# Patient Record
Sex: Male | Born: 1955 | Race: White | Hispanic: No | Marital: Single | Smoking: Never smoker
Health system: Southern US, Community
[De-identification: ages and names within clinical notes are randomized; demographics above are authoritative.]

## PROBLEM LIST (undated history)

## (undated) DIAGNOSIS — I509 Heart failure, unspecified: Secondary | ICD-10-CM

## (undated) DIAGNOSIS — E871 Hypo-osmolality and hyponatremia: Secondary | ICD-10-CM

## (undated) DIAGNOSIS — I4892 Unspecified atrial flutter: Secondary | ICD-10-CM

## (undated) DIAGNOSIS — R188 Other ascites: Secondary | ICD-10-CM

## (undated) DIAGNOSIS — K769 Liver disease, unspecified: Secondary | ICD-10-CM

## (undated) DIAGNOSIS — I723 Aneurysm of iliac artery: Secondary | ICD-10-CM

## (undated) DIAGNOSIS — F419 Anxiety disorder, unspecified: Secondary | ICD-10-CM

## (undated) DIAGNOSIS — I4891 Unspecified atrial fibrillation: Secondary | ICD-10-CM

## (undated) DIAGNOSIS — I428 Other cardiomyopathies: Secondary | ICD-10-CM

## (undated) DIAGNOSIS — K219 Gastro-esophageal reflux disease without esophagitis: Secondary | ICD-10-CM

## (undated) DIAGNOSIS — I10 Essential (primary) hypertension: Secondary | ICD-10-CM

---

## 2007-06-27 HISTORY — PX: INGUINAL HERNIA REPAIR: SUR1180

## 2007-07-10 ENCOUNTER — Ambulatory Visit (HOSPITAL_COMMUNITY): Admission: RE | Admit: 2007-07-10 | Discharge: 2007-07-10 | Payer: Self-pay | Admitting: Surgery

## 2011-05-11 NOTE — Op Note (Signed)
NAME:  Corey [name], Corey [name]                ACCOUNT NO.:  [id number]   MEDICAL RECORD NO.:  [id number]          PATIENT TYPE:  AMB   LOCATION:  DAY                          FACILITY:  [facility]   PHYSICIAN:  Ardeth Sportsman, MD     DATE OF BIRTH:  [birth year]   DATE OF PROCEDURE:  07/10/2007  DATE OF DISCHARGE:                               OPERATIVE REPORT   PRIMARY CARE PHYSICIAN:  Unavailable.   SURGEON:  Dr. Estelle Grumbles   ASSISTANT:  None.   PREOPERATIVE DIAGNOSIS:  Bilateral inguinal hernias, incarcerated to the  scrotum.   POSTOPERATIVE DIAGNOSIS:  Bilateral inguinal hernias, incarcerated to  the scrotum.   PROCEDURE PERFORMED:  Laparoscopic bilateral inguinal hernia repairs.   ANESTHESIA:  1. General.  2. Bupivacaine 0.25% in a field block around all port sites as well as      bilateral cord, ilioinguinal nerve, genitofemoral nerve blocks.   SPECIMENS:  None.   DRAINS:  None.   ESTIMATED BLOOD LOSS:  Less than 5 mL.   COMPLICATIONS:  None apparent.   INDICATIONS:  Mr. Gaudin is a 55 year old male, who has had chronic  inguinal hernias.  He was actually seen in our office about 9 years ago,  and recommendation was made for repair of the hernia that was only in  the left groin.  He did not want to have that done.  He has developed a  hernia on the right side as well, and the left side has gotten bigger.  Based on becoming more uncomfortable and now on both sides, he came to  [place] for reconsideration of surgical repair.   The anatomy and embryology of abdominal formation testicular migration  was explained.  Pathophysiology of inguinal herniation with its risks of  incarcerated strangulation and emergency surgery were discussed.  Recommendation was made for laparoscopic bilateral inguinal hernia  repair.  Risks such as stroke, heart attack,, deep venous thrombosis,  pulmonary embolism, and death were discussed.  Risks such as bleeding,  need for transfusion, ecchymosis, hematoma,  seroma, wound infection,  abscess, injury to other organs, urinary retention requiring  catheterization, testicular injury and/or loss and other risks were  discussed.   The patient also has some moles that are somewhat suspicious, one in his  right suprapubic area and one on his right shoulder.  He has had a prior  recommendation for the right groin nevus to be removed, but he refused  that.  Initially, he was willing to have excisional biopsies of these  moles done, but he changed his mind and does not want that done now.  I  talked to him again before surgery, recommending this, and he again  refused.  His mother has tried to convince him as well without success.   OPERATIVE FINDINGS:  He had bilateral scrotal hernias incarcerated but  not strangulated, left larger than right.   DESCRIPTION OF PROCEDURE:  Informed consent was confirmed.  The patient  received IV antibiotics just prior to surgery.  He underwent general  anesthesia without any difficulty.  He had sequential compression  devices active during  the entire case.  Because these were larger  hernias, I went ahead and had a Foley catheter sterilely placed.  The  patient was positioned supine; both arms were tucked.  His abdomen was  clipped, prepped, and draped in a sterile fashion.  After being under  general anesthesia, I could finally reduce the bilateral inguinal  hernias to make the dissection a little easier.   Entry was gained in the preperitoneal space through an infraumbilical  curvilinear incision.  Weston Brass was made in the anterior rectus fascia just  right and left of the midline.  I tried to free off some of the linea  alba off.  It was a little scarred in there, so I ultimately decided to  go just posterior to the right rectus abdominis muscle.  Capnopreperitoneum to 15 mmHg provided good abdominal insufflation.   Camera detection was used to help free the peritoneum off the anterior  abdominal wall and the  lower right and left lower quadrants.  Enough  working space was created such that a 5 mm port could be placed in the  right mid abdomen and left mid abdomen.   Attention was turned towards the larger and more chronic left side.  Peritoneum was freed up the abdominal wall laterally.  When it was made,  helped free the anteromedial bladder off the pelvic wall, taking care to  lift the midline raphe structures intact.  Peritoneum could be seen  crawling with the cord structures.  It seemed obviously large.  Peritoneum was freed off the cords structures circumferentially with a  large hernia sac ultimately reduced using primary blunt and some  controlled sharp dissection.  The sac was ultimately reduced intact into  the preperitoneal space.  Peritoneum was freed off the cord structures  as proximally as possible as well as laterally, superiorly, and then  medially.   Dissection was carried out in a mirror-image fashion on the right side.  Again, there was incarcerated right inguinal hernia indirect.  It was  not as large, but it was pretty close to it.   Two 15 x 15 cm Parietex polyester meshes were cut to a half-skull shape.  One mesh was cut for each side.  Mesh was placed such that a medial and  inferior flap rested in the true pelvis between the anteromedial wall of  the bladder and around the level of the obturator foramen.  The mesh lay  well inferiorly laterally, superiorly, and medially such that there was  at least 3 inches in circumferential coverage around the large dilated  inguinal ring and indirect defects.  This was done for each side in the  mirror-image fashion.   Lead points of hernia sacs are grasped and elevated cephalad.  The  capnopreperitoneum was evacuated.  Ports were removed.  Fascial defect  infraumbilically was closed using 0 Vicryl interrupted stitches x2.  Skin was closed using 4-0 Monocryl.  Sterile dressing was applied.  The  patient was extubated and  taken to the recovery room in stable  condition.   I explained the operative findings to the patient's mother.  Postop  instructions are discussed in detail.  Opportunity is given for  questions, and these were asked and answered.  She expressed  understanding and appreciation.      Ardeth Sportsman, MD  Electronically Signed     SCG/MEDQ  D:  07/10/2007  T:  07/10/2007  Job:  [id number]

## 2011-07-28 HISTORY — PX: CARDIOVERSION: SHX1299

## 2011-08-06 ENCOUNTER — Emergency Department (HOSPITAL_COMMUNITY): Payer: Self-pay

## 2011-08-06 ENCOUNTER — Inpatient Hospital Stay (HOSPITAL_COMMUNITY)
Admission: EM | Admit: 2011-08-06 | Discharge: 2011-08-11 | DRG: 308 | Disposition: A | Discharge status: Home / Self Care | Payer: Self-pay | Attending: Internal Medicine | Admitting: Internal Medicine

## 2011-08-06 DIAGNOSIS — J90 Pleural effusion, not elsewhere classified: Secondary | ICD-10-CM | POA: Diagnosis present

## 2011-08-06 DIAGNOSIS — R911 Solitary pulmonary nodule: Secondary | ICD-10-CM | POA: Diagnosis present

## 2011-08-06 DIAGNOSIS — I5021 Acute systolic (congestive) heart failure: Secondary | ICD-10-CM | POA: Diagnosis present

## 2011-08-06 DIAGNOSIS — F411 Generalized anxiety disorder: Secondary | ICD-10-CM | POA: Diagnosis not present

## 2011-08-06 DIAGNOSIS — E7800 Pure hypercholesterolemia, unspecified: Secondary | ICD-10-CM | POA: Diagnosis present

## 2011-08-06 DIAGNOSIS — I2510 Atherosclerotic heart disease of native coronary artery without angina pectoris: Secondary | ICD-10-CM | POA: Diagnosis present

## 2011-08-06 DIAGNOSIS — I10 Essential (primary) hypertension: Secondary | ICD-10-CM | POA: Diagnosis present

## 2011-08-06 DIAGNOSIS — R748 Abnormal levels of other serum enzymes: Secondary | ICD-10-CM | POA: Diagnosis present

## 2011-08-06 DIAGNOSIS — I509 Heart failure, unspecified: Secondary | ICD-10-CM | POA: Diagnosis present

## 2011-08-06 DIAGNOSIS — E871 Hypo-osmolality and hyponatremia: Secondary | ICD-10-CM | POA: Diagnosis not present

## 2011-08-06 DIAGNOSIS — I2584 Coronary atherosclerosis due to calcified coronary lesion: Secondary | ICD-10-CM | POA: Diagnosis present

## 2011-08-06 DIAGNOSIS — E876 Hypokalemia: Secondary | ICD-10-CM | POA: Diagnosis not present

## 2011-08-06 DIAGNOSIS — I4892 Unspecified atrial flutter: Principal | ICD-10-CM | POA: Diagnosis present

## 2011-08-06 DIAGNOSIS — N179 Acute kidney failure, unspecified: Secondary | ICD-10-CM | POA: Diagnosis present

## 2011-08-06 LAB — URINALYSIS, ROUTINE W REFLEX MICROSCOPIC
Ketones, ur: 15 mg/dL — AB
Nitrite: NEGATIVE
pH: 6 (ref 5.0–8.0)

## 2011-08-06 LAB — CARDIAC PANEL(CRET KIN+CKTOT+MB+TROPI): Total CK: 93 U/L (ref 7–232)

## 2011-08-06 LAB — CBC
MCHC: 33.7 g/dL (ref 30.0–36.0)
Platelets: 307 10*3/uL (ref 150–400)
RDW: 13.9 % (ref 11.5–15.5)

## 2011-08-06 LAB — BASIC METABOLIC PANEL
Calcium: 9.2 mg/dL (ref 8.4–10.5)
GFR calc Af Amer: >60 mL/min (ref >60)
GFR calc non Af Amer: >60 mL/min (ref >60)
Sodium: 135 mEq/L (ref 135–145)

## 2011-08-06 LAB — POCT I-STAT TROPONIN I
Troponin i, poc: 0.12 ng/mL (ref 0.00–0.08)
Troponin i, poc: 0.03 ng/mL (ref 0.00–0.08)

## 2011-08-06 LAB — DIFFERENTIAL
Basophils Absolute: 0 10*3/uL (ref 0.0–0.1)
Basophils Relative: 1 % (ref 0–1)
Eosinophils Relative: 1 % (ref 0–5)
Monocytes Absolute: 0.6 10*3/uL (ref 0.1–1.0)

## 2011-08-06 LAB — TROPONIN I: Troponin I: <0.3 ng/mL (ref <0.30)

## 2011-08-06 LAB — PRO B NATRIURETIC PEPTIDE: Pro B Natriuretic peptide (BNP): 3245 pg/mL — ABNORMAL HIGH (ref 0–125)

## 2011-08-06 LAB — MRSA PCR SCREENING: MRSA by PCR: NEGATIVE

## 2011-08-06 LAB — CK TOTAL AND CKMB (NOT AT ARMC): Relative Index: 3.5 — ABNORMAL HIGH (ref 0.0–2.5)

## 2011-08-06 LAB — D-DIMER, QUANTITATIVE: D-Dimer, Quant: 1.23 ug/mL-FEU — ABNORMAL HIGH (ref 0.00–0.48)

## 2011-08-06 MED ORDER — IOHEXOL 300 MG/ML  SOLN
100.0000 mL | Freq: Once | INTRAMUSCULAR | Status: AC | PRN
  Administered 2011-08-06: 100 mL via INTRAVENOUS

## 2011-08-07 LAB — URINE CULTURE
Colony Count: NO GROWTH
Culture  Setup Time: 201208101511

## 2011-08-07 LAB — CARDIAC PANEL(CRET KIN+CKTOT+MB+TROPI)
CK, MB: 4 ng/mL (ref 0.3–4.0)
Total CK: 69 U/L (ref 7–232)
Troponin I: <0.3 ng/mL (ref <0.30)

## 2011-08-07 LAB — HEPARIN LEVEL (UNFRACTIONATED)
Heparin Unfractionated: 0.11 IU/mL — ABNORMAL LOW (ref 0.30–0.70)
Heparin Unfractionated: 0.1 IU/mL — ABNORMAL LOW (ref 0.30–0.70)

## 2011-08-07 NOTE — H&P (Signed)
NAMEAMARRI, [name] NO.:  [id number]  MEDICAL RECORD NO.:  [id number]  LOCATION:  2923                         FACILITY:  [facility]  PHYSICIAN:  Tarry Kos, MD       DATE OF BIRTH:  [birth year]  DATE OF ADMISSION:  08/06/2011 DATE OF DISCHARGE:                             HISTORY & PHYSICAL   CHIEF COMPLAINT:  Shortness of breath for 2 weeks.  HISTORY OF PRESENT ILLNESS:  Mr. Corey [name] is a pleasant 55 year old male with a history of hypertension only who presents to the emergency department after having 2 weeks of progressive worsening shortness of breath.  It got to the point this morning where he could not breathe at all and was coughing up some frothy material and came to the ED.  He went to Urgent Care over a week ago, was found to have a heart rate of 150, was given a diuretic and a blood pressure medication, and was told that he possibly could have fluid on his lungs.  He went home, did not get any better, presented back to the Urgent Care this past Tuesday at which point they gave him some antibiotics, azithromycin.  He started the antibiotics, azithromycin and has continued to have shortness of breath.  Again, he is currently found to be in A flutter and some mild congestive heart failure.  This is all new for him.  He does not have any previous coronary or cardiac issues.  Cardiologist, Dr. Anne Fu has already seen the patient in the emergency department and has already written orders for appropriate medications.  REVIEW OF SYSTEMS:  He has not been running any fevers and no nausea, vomiting, or diarrhea and no lower extremity edema.  PAST MEDICAL HISTORY: 1. Hypertension. 2. Status post inguinal hernia repairs.  ALLERGIES:  None.  MEDICATIONS:  Lisinopril/hydrochlorothiazide and azithromycin.  SOCIAL HISTORY:  Nonsmoker.  No alcohol.  No IV drug abuse.  PHYSICAL EXAMINATION:  VITAL SIGNS:  Temperature is 97.5, blood pressure 139/96, heart rate  150, respirations 16, and 97% O2 sats on room air. GENERAL:  He is alert and oriented x4, in no apparent distress, cooperative and friendly. HEENT:  Extraocular muscles are intact.  Pupils are equal to light. Oropharynx is clear.  Mucous membranes are moist. NECK:  No JVD.  No carotid bruits. CARDIAC:  Irregular rate, regular rhythm without murmurs, rubs, or gallops. CHEST:  Mild decreased breath sounds at the bases.  No crackles.  No wheezes, rhonchi, or rales. ABDOMEN:  Soft, nontender, and nondistended.  Positive bowel sounds.  No hepatosplenomegaly. EXTREMITIES:  No clubbing, cyanosis, or edema. PSYCH:  Normal mood and affect. NEURO:  No focal neurologic deficits. SKIN:  No rashes.  12-lead EKG, SVT with a rate of 148, it looks like it is probably A flutter.  There are no acute ST-T wave changes.  CK-MB is normal. Troponin is negative.  BNP is 3245.  Urinalysis:  Specific gravity over 1.046.  Electrolytes normal.  BUN and creatinine normal.  D-dimer was positive at 1.23.  CBC is normal..  CTA of the chest showed some findings for central edema, showed some questionable coronary artery atherosclerosis which is advanced  for his age and a right lower lobe nodule.  ASSESSMENT/PLAN:  This is a 55 year old male with questionable atrial flutter with congestive heart failure. 1. Congestive heart failure, likely secondary to uncontrolled rate     with new onset atrial flutter.  Cardiology has already seen the     patient.  He has no other active medical issues.  He is being given     metoprolol to help control his rate.  Serial enzymes and thyroid     studies are pending.  He is going to get a dose of Lasix 2-D, 2-D     echo.  Heparin IV has been started.  Continue ACE inhibitor.  He is     on aspirin. 2. Abnormal CT with pulmonary nodule.  This will need outpatient     followup in 3 months. 3. Hypertension.  This is stable.  Continue ACE inhibitor and add on     beta-blocker. 4.  The patient is full code.  Further conditions pending overall     hospital course.          ______________________________ Tarry Kos, MD     RD/MEDQ  D:  08/06/2011  T:  08/07/2011  Job:  [id number]  Electronically Signed by Tarry Kos MD on 08/07/2011 05:54:05 PM

## 2011-08-08 LAB — BASIC METABOLIC PANEL
BUN: 44 mg/dL — ABNORMAL HIGH (ref 6–23)
CO2: 23 mEq/L (ref 19–32)
Calcium: 8.8 mg/dL (ref 8.4–10.5)
Creatinine, Ser: 2.01 mg/dL — ABNORMAL HIGH (ref 0.50–1.35)
GFR calc non Af Amer: 35 mL/min — ABNORMAL LOW (ref >60)
Glucose, Bld: 124 mg/dL — ABNORMAL HIGH (ref 70–99)
Sodium: 133 mEq/L — ABNORMAL LOW (ref 135–145)

## 2011-08-08 LAB — COMPREHENSIVE METABOLIC PANEL
ALT: 1053 U/L — ABNORMAL HIGH (ref 0–53)
Alkaline Phosphatase: 89 U/L (ref 39–117)
BUN: 35 mg/dL — ABNORMAL HIGH (ref 6–23)
CO2: 19 mEq/L (ref 19–32)
GFR calc Af Amer: 52 mL/min — ABNORMAL LOW (ref >60)
GFR calc non Af Amer: 43 mL/min — ABNORMAL LOW (ref >60)
Glucose, Bld: 127 mg/dL — ABNORMAL HIGH (ref 70–99)
Potassium: 4.5 mEq/L (ref 3.5–5.1)
Sodium: 134 mEq/L — ABNORMAL LOW (ref 135–145)
Total Bilirubin: 1 mg/dL (ref 0.3–1.2)
Total Protein: 5.8 g/dL — ABNORMAL LOW (ref 6.0–8.3)

## 2011-08-08 LAB — CBC
Hemoglobin: 14.4 g/dL (ref 13.0–17.0)
MCH: 30.1 pg (ref 26.0–34.0)
MCHC: 34.1 g/dL (ref 30.0–36.0)
MCV: 88.1 fL (ref 78.0–100.0)
Platelets: 270 10*3/uL (ref 150–400)

## 2011-08-08 LAB — PROTIME-INR: Prothrombin Time: 21.6 seconds — ABNORMAL HIGH (ref 11.6–15.2)

## 2011-08-08 LAB — HEPARIN LEVEL (UNFRACTIONATED): Heparin Unfractionated: 0.29 IU/mL — ABNORMAL LOW (ref 0.30–0.70)

## 2011-08-09 ENCOUNTER — Inpatient Hospital Stay (HOSPITAL_COMMUNITY): Payer: Self-pay

## 2011-08-09 LAB — CBC
HCT: 41.4 % (ref 39.0–52.0)
Hemoglobin: 13.8 g/dL (ref 13.0–17.0)
MCH: 29.4 pg (ref 26.0–34.0)
MCHC: 33.3 g/dL (ref 30.0–36.0)
MCV: 88.3 fL (ref 78.0–100.0)
RDW: 14 % (ref 11.5–15.5)

## 2011-08-09 LAB — COMPREHENSIVE METABOLIC PANEL
Albumin: 3.3 g/dL — ABNORMAL LOW (ref 3.5–5.2)
Alkaline Phosphatase: 68 U/L (ref 39–117)
BUN: 49 mg/dL — ABNORMAL HIGH (ref 6–23)
Creatinine, Ser: 1.8 mg/dL — ABNORMAL HIGH (ref 0.50–1.35)
GFR calc Af Amer: 48 mL/min — ABNORMAL LOW (ref >60)
Glucose, Bld: 82 mg/dL (ref 70–99)
Potassium: 4.4 mEq/L (ref 3.5–5.1)
Total Protein: 5.6 g/dL — ABNORMAL LOW (ref 6.0–8.3)

## 2011-08-09 LAB — BASIC METABOLIC PANEL
CO2: 31 mEq/L (ref 19–32)
Calcium: 8.5 mg/dL (ref 8.4–10.5)
GFR calc non Af Amer: 47 mL/min — ABNORMAL LOW (ref >60)
Potassium: 3.7 mEq/L (ref 3.5–5.1)
Sodium: 130 mEq/L — ABNORMAL LOW (ref 135–145)

## 2011-08-09 LAB — PROTIME-INR: INR: 1.66 — ABNORMAL HIGH (ref 0.00–1.49)

## 2011-08-10 LAB — COMPREHENSIVE METABOLIC PANEL
ALT: 599 U/L — ABNORMAL HIGH (ref 0–53)
AST: 243 U/L — ABNORMAL HIGH (ref 0–37)
CO2: 38 mEq/L — ABNORMAL HIGH (ref 19–32)
Chloride: 87 mEq/L — ABNORMAL LOW (ref 96–112)
GFR calc non Af Amer: >60 mL/min (ref >60)
Glucose, Bld: 92 mg/dL (ref 70–99)
Sodium: 133 mEq/L — ABNORMAL LOW (ref 135–145)
Total Bilirubin: 1 mg/dL (ref 0.3–1.2)

## 2011-08-10 LAB — CBC
Hemoglobin: 14.1 g/dL (ref 13.0–17.0)
MCV: 86.8 fL (ref 78.0–100.0)
Platelets: 215 10*3/uL (ref 150–400)
RBC: 4.76 MIL/uL (ref 4.22–5.81)
WBC: 9.7 10*3/uL (ref 4.0–10.5)

## 2011-08-10 LAB — POTASSIUM
Potassium: 3 mEq/L — ABNORMAL LOW (ref 3.5–5.1)
Potassium: 2.9 mEq/L — ABNORMAL LOW (ref 3.5–5.1)
Potassium: 3.2 mEq/L — ABNORMAL LOW (ref 3.5–5.1)

## 2011-08-10 LAB — HEPATITIS PANEL, ACUTE: Hep B C IgM: NEGATIVE

## 2011-08-10 NOTE — Consult Note (Signed)
Corey [name], Corey [name] NO.:  [id number]  MEDICAL RECORD NO.:  [id number]  LOCATION:  2923                         FACILITY:  [facility]  PHYSICIAN:  Jake Bathe, MD      DATE OF BIRTH:  1956/12/15  DATE OF CONSULTATION:  08/06/2011 DATE OF DISCHARGE:                                CONSULTATION   REQUESTING PHYSICIAN:  April Palumbo-Rasch, MD, [facility].  REASON FOR CONSULTATION:  Evaluation of tachycardia, shortness of breath.  HISTORY OF PRESENT ILLNESS:  Corey [name] is a 55 year old male with no prior cardiologist or primary care physician who over the last 2 weeks has been experiencing increased shortness of breath and most recently has noted tachycardia paroxysmally which is symptomatic.  Approximately a week ago, he went to the Urgent Care Center because of this shortness of breath and increasing exertional dyspnea and was given azithromycin and lisinopril for elevated blood pressure.  Today he reported to the [facility] emergency department because of increasing shortness of breath, increasing anxiety surrounding this.  While here in the emergency department, his first set of point-of-care cardiac markers demonstrated a positive troponin, however, the second set was unremarkable at 0.03.  A CT angiogram of the chest was performed after a D-dimer was positive and demonstrated mild pleural effusions, mild cardiomegaly, no pericardial effusion, some coronary artery calcification, and extensive mediastinal edema, right hilar nodal tissue measuring 2.2 cm and possible edema present.  This was personally reviewed.  Chest x-ray demonstrated mildly enlarged cardiac silhouette with possible interstitial edema as well.  On telemetry upon close monitoring, he appears to have atrial flutter underlying, a narrow complex tachycardia.  EKGs will be repeated.  Currently he is comfortable in bed.  No evidence of tachypnea currently, mildly  anxious. His mother is currently present with him.  PAST MEDICAL HISTORY:  No significant past medical history except for hypertension.  ALLERGIES:  No known drug allergies.  MEDICATIONS:  None.  SOCIAL HISTORY:  Denies any smoking, very rare alcohol.  No illicit drug use.  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  FAMILY HISTORY:  His father had heart attack at age 69, but died from a rare muscle disorder.  Mother has atrial fibrillation.  REVIEW OF SYSTEMS:  Denies any recent fevers, chills, nausea, vomiting. It is positive for shortness of breath.  Positive for white mucus production yesterday.  No melena.  No syncope.  No dizziness.  No prior anginal symptoms.  Unless specified above, all other 12 review of systems negative.  PHYSICAL EXAMINATION:  VITAL SIGNS:  Afebrile, blood pressure 139/96, pulse is 148 and consistent, respirations 16, satting 97% on room air. GENERAL:  He is alert and oriented x3 in no acute distress, pleasant male, mildly anxious with his family at bedside. EYES:  Well perfused conjunctivae.  EOMI.  No scleral icterus. NECK:  Supple.  No lymphadenopathy.  No thyromegaly.  No carotid bruits. No JVD. CARDIOVASCULAR:  Tachycardic, regular rhythm without any appreciable murmurs, rubs, or gallops. LUNGS:  Clear to auscultation bilaterally.  Normal respiratory effort except for mildly decreased  breath sounds bilaterally. ABDOMEN:  Soft, nontender, normoactive bowel sounds.  No bruits appreciated. EXTREMITIES:  No clubbing, cyanosis, or edema.  Normal distal pulses. GU:  Deferred. RECTAL:  Deferred. NEUROLOGIC:  Nonfocal, intact.  DATA:  CT angiogram of chest as described above.  Chest x-ray as described above.  EKG as described above.  Most likely atrial flutter underlying.  No ST-segment changes.  TSH and free T4 currently pending. Urinalysis unremarkable except for protein.  Second  set of cardiac point- of-care markers are normal, glucose mildly elevated at 124.  ASSESSMENT/PLAN:  A 55 year old male with supraventricular tachycardia, likely underlying atrial flutter, paroxysmal per history with increasing shortness of breath, mild mucus production. 1. Supraventricular tachycardia, likely atrial flutter - upon close     inspection of telemetry, it does appear to be P-waves marching out     consistent with 2:1 atrial flutter.  He notes that earlier this     morning the tachycardia initiated and he has had periods of time     where he does not feel this.  It is most likely that he is having     some paroxysmal atrial flutter.  I will start by giving him 5 mg of     IV metoprolol and this can be repeated in 10 minutes and heart rate     is not decreased.  I will also give him metoprolol 25 mg p.o. q.6     h. with hold parameters.  He may very well require diltiazem as     well.  However, I would like to check a echocardiogram to ensure     proper cardiac ejection fraction.  Certainly he may be developing a     tachycardia induced cardiomyopathy if his tachycardia has been     persistent for quite some time which could be leading to his     overall shortness of breath.  He does demonstrate some evidence     of mild pulmonary edema on chest x-ray and therefore I will go     ahead and administer Lasix 40 mg p.o. x1 with potassium 20 mEq p.o.     x1 as well.  We will check thyroid functions as described above.     Heparin  IV in case cardioversion is required.  His CHADS2 score is     1 for hypertension currently.  No prior stroke-like symptoms.     Aspirin would be reasonable. If EF poor, then coumadin.  2. Hypertension - continue lisinopril 20 mg a day.  We will continue     to follow along with admitting team.  As stated above, he may     require cardioversion but hopefully given his paroxysmal symptoms,     he will break his tachycardia.  We will continue to  monitor. 3. Dyspnea-as above. Assessing EF for possible systolic versus diastolic     heart failure. Will treat accordingly.      Jake Bathe, MD     MCS/MEDQ  D:  08/06/2011  T:  08/07/2011  Job:  [id number]  cc:   April Palumbo-Rasch, MD  Electronically Signed by Donato Schultz MD on 08/10/2011 06:55:38 AM

## 2011-08-11 LAB — COMPREHENSIVE METABOLIC PANEL
ALT: 544 U/L — ABNORMAL HIGH (ref 0–53)
AST: 227 U/L — ABNORMAL HIGH (ref 0–37)
Albumin: 3.2 g/dL — ABNORMAL LOW (ref 3.5–5.2)
CO2: 35 mEq/L — ABNORMAL HIGH (ref 19–32)
Chloride: 87 mEq/L — ABNORMAL LOW (ref 96–112)
Creatinine, Ser: 1 mg/dL (ref 0.50–1.35)
Potassium: 3.6 mEq/L (ref 3.5–5.1)
Sodium: 131 mEq/L — ABNORMAL LOW (ref 135–145)
Total Bilirubin: 0.9 mg/dL (ref 0.3–1.2)

## 2011-08-11 LAB — CBC
HCT: 43.4 % (ref 39.0–52.0)
MCH: 29.3 pg (ref 26.0–34.0)
MCHC: 33.2 g/dL (ref 30.0–36.0)
MCV: 88.4 fL (ref 78.0–100.0)
Platelets: 235 10*3/uL (ref 150–400)
RDW: 13.8 % (ref 11.5–15.5)
WBC: 10.9 10*3/uL — ABNORMAL HIGH (ref 4.0–10.5)

## 2011-08-11 LAB — PROTIME-INR: INR: 1.42 (ref 0.00–1.49)

## 2011-08-11 NOTE — Discharge Summary (Signed)
Corey [name], Corey [name] NO.:  [id number]  MEDICAL RECORD NO.:  [id number]  LOCATION:  2923                         FACILITY:  [facility]  PHYSICIAN:  Jeoffrey Massed, MD    DATE OF BIRTH:  09/21/1956  DATE OF ADMISSION:  08/06/2011 DATE OF DISCHARGE:                        DISCHARGE SUMMARY - REFERRING   PRIMARY CARDIOLOGIST:  Jake Bathe, MD of [facility].  PRIMARY DISCHARGE DIAGNOSES: 1. Acute systolic heart failure. 2. Supraventricular tachycardia likely atrial flutter status post DC     cardioversion, now on Coumadin therapy. 3. Acute renal failure, now resolved. 4. Transaminitis likely secondary to shock liver, significantly     better. 5. Mild hyponatremia. 6. Hypokalemia secondary to diuretic therapy, significantly better. 7. Abnormal CT scan with pulmonary nodule.  Will need a repeat CT scan     in the next 6-12 months.  PAST MEDICAL HISTORY/SECONDARY DISCHARGE DIAGNOSES:  Hypertension.DISCHARGE MEDICATIONS: 1. Lovenox 90 mg subcutaneously twice daily to take until INR is     between 2 and 3. 2. Lasix 40 mg 1 tablet p.o. daily. 3. Lisinopril 2.5 mg 1 tablet p.o. daily. 4. Lopressor 25 mg 1 tablet twice daily. 5. K-Dur 20 mEq 1 tablet daily. 6. Aspirin 81 mg 1 tablet p.o. daily. 7. Coumadin 7.5 mg p.o. daily.  CONSULTANTS:  Dr. Donato Schultz from [facility].  BRIEF HISTORY OF PRESENT ILLNESS:  Corey [name] is a very pleasant 55 year old white male, who prior to this admission had only a known history of hypertension presented to the hospital for worsening shortness of breath, was found to have tachycardia that was thought secondary to be from SVT likely atrial flutter.  He was also found to have acute congestive systolic heart failure and as a result was admitted to a step- down unit for further closer monitoring.  For further details, please see the history and physical that was dictated by Dr. Onalee Hua on admission.  PERTINENT  RADIOLOGICAL STUDIES: 1. A portable chest x-ray done on August 06, 2011, showed cardiomegaly     with early bibasilar interstitial edema pattern. 2. CT angiogram of the chest showed no evidence of pulmonary embolism,     findings of congestive heart failure.  Age-advanced coronary artery     atherosclerosis, including within the distal left main.  Small     bilateral pleural effusions.  Mild thoracic adenopathy.  This can     be seen with congestive heart failure.  Borderline pulmonary artery     enlargement.  Right lower lung nodule. 3. A 2-D echocardiogram done on August 07, 2011, showed systolic     function to be severely reduced with an EF around 20% to 25% along     with diffuse hypokinesis.  There is moderate-to-severe mitral     regurgitation.  Left atrium was moderately dilated.  The right     atrium was also moderately dilated.  The pulmonary artery pressures     were 33 mmHg. 4. A transesophageal echocardiogram done by Dr. Donato Schultz on August 07, 2011, showed systolic function to be severely reduced with an     EF around 20% to 25%.  There was  no evidence of thrombus in the     left atrial cavity or its appendage.  PERTINENT LABORATORY STUDIES: 1. INR on discharge is 1.42. 2. Creatinine on discharge is 1.0, potassium on discharge is 3.6. 3. LFTs on discharge shows AST of 227 and ALT of 544. 4. Acute hepatitis panel is negative. 5. HbA1c is 6.2.  Cardiac enzymes were cycled and these were negative     as well. 6. TSH was 3.002. 7. Urine culture was negative.  BRIEF HOSPITAL COURSE: 1. Shortness of breath.  This is secondary to acute systolic heart     failure with an EF around 20% to 25% per the echocardiogram done     this admission.  The patient was seen by Cardiology and was placed     on IV diuretic therapy along with ACE inhibitor and beta-blocker.     The patient gradually improved with this therapy.  It is not very     clear as to the exact cause of his  systolic heart failure, perhaps     it could be related to his atrial flutter and possibly be a     tachycardia-induced cardiomyopathy.  However, it also could be     underlying coronary artery disease as well.  Further workup of this     will need to be done when the patient follows up with Cardiology as     an outpatient.  We will defer this workup to his primary     cardiologist now. 2. Atrial flutter.  During this admission, the patient did get IV     metoprolol which did control his atrial flutter, however, during     his admission, he also did require DC cardioversion.  He has been     sustained in sinus ever since.  A transesophageal echocardiogram     done prior to DC cardioversion is noted as above, but did not show     any thrombus in the left atrial cavity or its appendage.  EF was     confirmed to be around 20% to 25%.  The patient continues to do     well and is maintained on low-dose beta-blockers.  He did briefly     require amiodarone, but Cardiology suggested that we discontinue     this for now and he will not be discharged on amiodarone.  He has     been started on Coumadin with overlapping Lovenox and plans are to     discharge home if outpatient Lovenox therapy can be arranged for     this patient later today.  The patient already has a INR check     scheduled at [facility] this coming Friday at 3:45 p.m. and     the patient will then follow up with Dr. Donato Schultz on August 19, 2011, at 11:15 a.m. 3. Pulmonary nodule.  The patient will need a repeat CT scan of the     chest done in the next 6-12 months.  We will defer this to his     primary cardiologist and his primary care practitioner. 4. Hypertension.  This is stable.  The patient will continue with low-     dose lisinopril and beta-blockers.  Further optimization of this     regimen will be done by his primary care practitioner. 5. Mild acute renal failure.  This was in the setting of rapid atrial      flutter and had subsequently normalized.  6. Elevated transaminases.  This is in the setting of low-flow state     secondary to rapid atrial flutter and is highly suspicious for     shock liver.  His liver function enzymes are coming down very     nicely and will need follow up when he follows up with Dr. Anne Fu     as an outpatient next week.  DISPOSITION:  It is thought that the patient is stable to be discharged home.  FOLLOWUP INSTRUCTIONS: 1. The patient will follow up with [facility] on Friday, August 13, 2011, at 3:45 p.m. for an INR check.  Goal INR is between 2 and     3. 2. The patient will then subsequently follow up with Dr. Donato Schultz     of [facility] on Thursday, August 19, 2011, at 11:15 a.m. 3. [facility] per instructions in the pink discharge sheet.  Total time spent for discharge equals 45 minutes.     Jeoffrey Massed, MD     SG/MEDQ  D:  08/11/2011  T:  08/11/2011  Job:  [id number]  cc:   Jake Bathe, MD  Electronically Signed by Jeoffrey Massed  on 08/11/2011 05:48:21 PM

## 2011-08-19 ENCOUNTER — Inpatient Hospital Stay (HOSPITAL_COMMUNITY)
Admission: AD | Admit: 2011-08-19 | Discharge: 2011-08-22 | DRG: 308 | Disposition: A | Discharge status: Home / Self Care | Payer: Self-pay | Source: Ambulatory Visit | Attending: Cardiology | Admitting: Cardiology

## 2011-08-19 ENCOUNTER — Inpatient Hospital Stay (HOSPITAL_COMMUNITY): Payer: Self-pay

## 2011-08-19 DIAGNOSIS — I509 Heart failure, unspecified: Secondary | ICD-10-CM | POA: Diagnosis present

## 2011-08-19 DIAGNOSIS — I428 Other cardiomyopathies: Secondary | ICD-10-CM | POA: Diagnosis present

## 2011-08-19 DIAGNOSIS — Z7982 Long term (current) use of aspirin: Secondary | ICD-10-CM

## 2011-08-19 DIAGNOSIS — I5023 Acute on chronic systolic (congestive) heart failure: Secondary | ICD-10-CM | POA: Diagnosis present

## 2011-08-19 DIAGNOSIS — Z7901 Long term (current) use of anticoagulants: Secondary | ICD-10-CM

## 2011-08-19 DIAGNOSIS — N179 Acute kidney failure, unspecified: Secondary | ICD-10-CM | POA: Diagnosis present

## 2011-08-19 DIAGNOSIS — I4892 Unspecified atrial flutter: Principal | ICD-10-CM | POA: Diagnosis present

## 2011-08-19 LAB — DIFFERENTIAL
Eosinophils Relative: 1 % (ref 0–5)
Lymphocytes Relative: 16 % (ref 12–46)
Lymphs Abs: 1.4 10*3/uL (ref 0.7–4.0)
Monocytes Absolute: 0.7 10*3/uL (ref 0.1–1.0)
Monocytes Relative: 7 % (ref 3–12)
Neutro Abs: 6.7 10*3/uL (ref 1.7–7.7)

## 2011-08-19 LAB — CARDIAC PANEL(CRET KIN+CKTOT+MB+TROPI)
CK, MB: 2.6 ng/mL (ref 0.3–4.0)
Relative Index: INVALID (ref 0.0–2.5)
Relative Index: INVALID (ref 0.0–2.5)
Total CK: 36 U/L (ref 7–232)
Troponin I: 0.46 ng/mL (ref <0.30)
Troponin I: <0.3 ng/mL (ref <0.30)

## 2011-08-19 LAB — COMPREHENSIVE METABOLIC PANEL
ALT: 58 U/L — ABNORMAL HIGH (ref 0–53)
Albumin: 3.1 g/dL — ABNORMAL LOW (ref 3.5–5.2)
Alkaline Phosphatase: 91 U/L (ref 39–117)
Calcium: 9.5 mg/dL (ref 8.4–10.5)
GFR calc Af Amer: >60 mL/min (ref >60)
Potassium: 4.5 mEq/L (ref 3.5–5.1)
Sodium: 136 mEq/L (ref 135–145)
Total Protein: 6 g/dL (ref 6.0–8.3)

## 2011-08-19 LAB — CBC
HCT: 43.5 % (ref 39.0–52.0)
Hemoglobin: 14.6 g/dL (ref 13.0–17.0)
MCH: 29.7 pg (ref 26.0–34.0)
MCHC: 33.6 g/dL (ref 30.0–36.0)
MCV: 88.6 fL (ref 78.0–100.0)
RDW: 14.4 % (ref 11.5–15.5)

## 2011-08-20 LAB — BASIC METABOLIC PANEL
BUN: 14 mg/dL (ref 6–23)
CO2: 24 mEq/L (ref 19–32)
GFR calc non Af Amer: >60 mL/min (ref >60)
Glucose, Bld: 106 mg/dL — ABNORMAL HIGH (ref 70–99)
Potassium: 4.6 mEq/L (ref 3.5–5.1)
Sodium: 137 mEq/L (ref 135–145)

## 2011-08-20 LAB — CARDIAC PANEL(CRET KIN+CKTOT+MB+TROPI)
CK, MB: 2.4 ng/mL (ref 0.3–4.0)
Total CK: 27 U/L (ref 7–232)

## 2011-08-20 LAB — PROTIME-INR: Prothrombin Time: 48 seconds — ABNORMAL HIGH (ref 11.6–15.2)

## 2011-08-21 LAB — BASIC METABOLIC PANEL
CO2: 30 mEq/L (ref 19–32)
Chloride: 97 mEq/L (ref 96–112)
GFR calc Af Amer: >60 mL/min (ref >60)
Potassium: 4.5 mEq/L (ref 3.5–5.1)
Sodium: 135 mEq/L (ref 135–145)

## 2011-08-21 LAB — PROTIME-INR
INR: 4.61 — ABNORMAL HIGH (ref 0.00–1.49)
Prothrombin Time: 44.2 seconds — ABNORMAL HIGH (ref 11.6–15.2)

## 2011-08-21 LAB — PRO B NATRIURETIC PEPTIDE: Pro B Natriuretic peptide (BNP): 4749 pg/mL — ABNORMAL HIGH (ref 0–125)

## 2011-08-22 LAB — PROTIME-INR
INR: 3.61 — ABNORMAL HIGH (ref 0.00–1.49)
Prothrombin Time: 36.5 seconds — ABNORMAL HIGH (ref 11.6–15.2)

## 2011-08-22 LAB — BASIC METABOLIC PANEL
Calcium: 8.7 mg/dL (ref 8.4–10.5)
GFR calc Af Amer: >60 mL/min (ref >60)
GFR calc non Af Amer: >60 mL/min (ref >60)
Glucose, Bld: 93 mg/dL (ref 70–99)
Potassium: 3.6 mEq/L (ref 3.5–5.1)
Sodium: 135 mEq/L (ref 135–145)

## 2011-08-24 NOTE — Op Note (Signed)
  [name], [name]                ACCOUNT NO.:  [id number]  MEDICAL RECORD NO.:  [id number]  LOCATION:  2902                         FACILITY:  [facility]  PHYSICIAN:  Armanda Magic, M.D.     DATE OF BIRTH:  October 28, 1956  DATE OF PROCEDURE:  08/20/2011 DATE OF DISCHARGE:                              OPERATIVE REPORT   PRIMARY CARDIOLOGIST:  Jake Bathe, MD  PROCEDURE:  Direct current cardioversion.  OPERATOR:  Armanda Magic, MD  INDICATION:  Atrial flutter with rapid ventricular response in the setting of CHF.  COMPLICATIONS:  None.  IV MEDICATIONS:  Propofol 90 mg IV.  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  At that time, he had elevated LFTs in the setting of shock liver and decreased cardiac output.  He has been on chronic systemic anticoagulation and came back into the office with recurrent atrial flutter with rapid ventricular response.  He was admitted to the hospital and was made n.p.o. after midnight.  The patient was connected to continuous heart and pulse oximetry monitoring and intermittent blood pressure monitoring.  After adequate anesthesia was obtained, a 200-joule synchronized biphasic shock was delivered, which successfully converted the patient to sinus rhythm.  The patient tolerated the procedure well without complications.  ASSESSMENT: 1. Atrial flutter with rapid ventricular response. 2. Systemic anticoagulation with supratherapeutic INR. 3. Tachycardia-induced cardiomyopathy. 4. Acute on chronic systolic heart failure secondary to #1. 5. Successful cardioversion to sinus rhythm.  PLAN:  We will hold Coumadin today due to supratherapeutic INR. Pharmacy is currently managing.  We will check a PT/INR in the morning. As per hospital note from today, we will change p.o. to IV Lasix given his heart failure.     Armanda Magic,  M.D.     TT/MEDQ  D:  08/20/2011  T:  08/20/2011  Job:  [id number]  cc:   Jake Bathe, MD  Electronically Signed by Armanda Magic M.D. on 08/24/2011 08:35:41 AM

## 2011-08-27 NOTE — Discharge Summary (Signed)
  [name], Corey [name] NO.:  [id number]  MEDICAL RECORD NO.:  [id number]  LOCATION:  4714                         FACILITY:  [facility]  PHYSICIAN:  Lyn Records, M.D.   DATE OF BIRTH:  1956/05/10  DATE OF ADMISSION:  08/19/2011 DATE OF DISCHARGE:  08/22/2011                              DISCHARGE SUMMARY   REASON FOR ADMISSION TO HOSPITAL:  Recurrent atrial flutter with rapid ventricular response and decompensated heart failure.  DISCHARGE DIAGNOSES: 1. Atrial flutter reverted to normal sinus rhythm with electrical     cardioversion by Dr. Armanda Magic on August 20, 2011. 2. Acute on chronic systolic heart failure, resolved after diuresis     and conversion of atrial arrhythmia. 3. Anticoagulation with supratherapeutic INR after starting     amiodarone, resolving at the time of discharge with INR of 3.6.  PROCEDURES PERFORMED:  Elective electrical cardioversion, August 20, 2011.  DISCHARGE INSTRUCTIONS:1. Amiodarone 200 mg twice daily until September 08, 2011, then down     to 1 tablet daily. 2. Warfarin 7.5 mg tablets, the patient is to take 1/2 tablet per day     and he is to resume warfarin on August 23, 2011. 3. Furosemide 40 mg per day. 4. Lisinopril 2.5 mg per day. 5. Metoprolol 25 mg twice a day. 6. Potassium 20 mEq per day.  FOLLOWUP APPOINTMENTS: 1. Dr. Clelia Croft at [facility] on September 22, 2011. 2. [facility] on August 25, 2011. 3. Dr. Donato Schultz within 7-10 days.  The office will confirm the     appointment with the patient.  ACTIVITY:  As tolerated.  He is to call if shortness of breath, bleeding, or other clinical problems.  CONDITION ON DISCHARGE:  Improved.  HISTORY AND PHYSICAL AND HOSPITAL COURSE:  Please see the admitting history and physical.  The patient developed recurrent atrial fibrillation and was hospitalized because of decompensated CHF.  He was admitted from my office on August 19, 2011.  Amiodarone was started.   At the following day, he underwent electrical cardioversion by Dr. Armanda Magic.  Diuresis and conversion of the rhythm resulted in resolution of heart failure.  On the morning of August 22, 2011, he was felt eligible for discharge.  INR was 3.6.  He will resume Coumadin on August 23, 2011 at 3.75 mg every day.  His other medications have not changed and amiodarone has been added and will be decreased to 200 mg daily by Dr. Anne Fu over the next several weeks.     Lyn Records, M.D.     HWS/MEDQ  D:  08/22/2011  T:  08/22/2011  Job:  [id number]  cc:   Kari Baars, M.D.  Electronically Signed by Verdis Prime M.D. on 08/27/2011 04:23:29 PM

## 2011-10-12 LAB — HEMOGLOBIN AND HEMATOCRIT, BLOOD
HCT: 46.6
Hemoglobin: 16

## 2012-01-14 ENCOUNTER — Emergency Department (HOSPITAL_COMMUNITY)
Admission: EM | Admit: 2012-01-14 | Discharge: 2012-01-14 | Disposition: A | Discharge status: Home / Self Care | Payer: PRIVATE HEALTH INSURANCE | Attending: Emergency Medicine | Admitting: Emergency Medicine

## 2012-01-14 ENCOUNTER — Other Ambulatory Visit: Payer: Self-pay

## 2012-01-14 ENCOUNTER — Encounter (HOSPITAL_COMMUNITY): Payer: Self-pay | Admitting: *Deleted

## 2012-01-14 ENCOUNTER — Emergency Department (HOSPITAL_COMMUNITY): Payer: PRIVATE HEALTH INSURANCE

## 2012-01-14 DIAGNOSIS — R609 Edema, unspecified: Secondary | ICD-10-CM | POA: Insufficient documentation

## 2012-01-14 DIAGNOSIS — R0602 Shortness of breath: Secondary | ICD-10-CM | POA: Insufficient documentation

## 2012-01-14 DIAGNOSIS — R0601 Orthopnea: Secondary | ICD-10-CM | POA: Insufficient documentation

## 2012-01-14 DIAGNOSIS — R791 Abnormal coagulation profile: Secondary | ICD-10-CM | POA: Insufficient documentation

## 2012-01-14 DIAGNOSIS — I509 Heart failure, unspecified: Secondary | ICD-10-CM | POA: Insufficient documentation

## 2012-01-14 DIAGNOSIS — R0609 Other forms of dyspnea: Secondary | ICD-10-CM | POA: Insufficient documentation

## 2012-01-14 DIAGNOSIS — M7989 Other specified soft tissue disorders: Secondary | ICD-10-CM | POA: Insufficient documentation

## 2012-01-14 DIAGNOSIS — I10 Essential (primary) hypertension: Secondary | ICD-10-CM | POA: Insufficient documentation

## 2012-01-14 DIAGNOSIS — R0989 Other specified symptoms and signs involving the circulatory and respiratory systems: Secondary | ICD-10-CM | POA: Insufficient documentation

## 2012-01-14 HISTORY — DX: Essential (primary) hypertension: I10

## 2012-01-14 LAB — BASIC METABOLIC PANEL
CO2: 24 mEq/L (ref 19–32)
Calcium: 9.3 mg/dL (ref 8.4–10.5)
Creatinine, Ser: 1.19 mg/dL (ref 0.50–1.35)
GFR calc non Af Amer: 68 mL/min — ABNORMAL LOW (ref >90)
Sodium: 132 mEq/L — ABNORMAL LOW (ref 135–145)

## 2012-01-14 LAB — PROTIME-INR: INR: 1.32 (ref 0.00–1.49)

## 2012-01-14 LAB — DIFFERENTIAL
Basophils Absolute: 0 10*3/uL (ref 0.0–0.1)
Basophils Relative: 0 % (ref 0–1)
Eosinophils Relative: 0 % (ref 0–5)
Monocytes Absolute: 1.1 10*3/uL — ABNORMAL HIGH (ref 0.1–1.0)
Neutro Abs: 8.1 10*3/uL — ABNORMAL HIGH (ref 1.7–7.7)

## 2012-01-14 LAB — CBC
HCT: 44.9 % (ref 39.0–52.0)
MCHC: 33.2 g/dL (ref 30.0–36.0)
Platelets: 315 10*3/uL (ref 150–400)
RDW: 15 % (ref 11.5–15.5)

## 2012-01-14 LAB — PRO B NATRIURETIC PEPTIDE: Pro B Natriuretic peptide (BNP): 11106 pg/mL — ABNORMAL HIGH (ref 0–125)

## 2012-01-14 LAB — POCT I-STAT TROPONIN I: Troponin i, poc: 0.03 ng/mL (ref 0.00–0.08)

## 2012-01-14 MED ORDER — FUROSEMIDE 10 MG/ML IJ SOLN
40.0000 mg | Freq: Once | INTRAMUSCULAR | Status: AC
  Administered 2012-01-14: 40 mg via INTRAVENOUS
  Filled 2012-01-14: qty 4

## 2012-01-14 MED ORDER — FUROSEMIDE 40 MG PO TABS: 40.0000 mg | ORAL_TABLET | Freq: Every day | ORAL | Status: DC

## 2012-01-14 MED ORDER — FUROSEMIDE 10 MG/ML IJ SOLN
40.0000 mg | Freq: Once | INTRAMUSCULAR | Status: AC
Start: 2012-01-14 — End: 2012-01-14
  Administered 2012-01-14: 40 mg via INTRAVENOUS
  Filled 2012-01-14: qty 4

## 2012-01-14 NOTE — ED Notes (Signed)
Patient with shortness of breath that comes and goes.  Patient has also noted swelling in both of his legs.  Patient has hx of irreg heart rhythm.

## 2012-01-14 NOTE — ED Notes (Signed)
Patient did well ambulating to end of hallway and back.

## 2012-01-14 NOTE — ED Provider Notes (Signed)
History     CSN: [id number]  Arrival date & time 01/14/12  1455   First MD Initiated Contact with Patient 01/14/12 1614      Chief Complaint  Patient presents with  . Shortness of Breath    (Consider location/radiation/quality/duration/timing/severity/associated sxs/prior treatment) HPI Comments: 56yo CM with PMH significant for atrial flutter and systolic heart failure who presents to the ED due to dyspnea and leg swelling. Has had worsening swelling of his legs over the last few weeks. Has had approximately 20lb weight loss over the last 2 weeks.   Patient is a 56 y.o. male presenting with shortness of breath. The history is provided by the patient and medical records.  Shortness of Breath  Episode onset: several days ago. The onset was gradual. The problem occurs continuously. The problem has been gradually worsening. The problem is moderate. The symptoms are relieved by nothing. Exacerbated by: exertion or lying flat. Associated symptoms include orthopnea and shortness of breath (worse with exertion). Pertinent negatives include no chest pain, no chest pressure, no fever, no rhinorrhea, no sore throat, no stridor, no cough and no wheezing. Past medical history comments: CHF. He has been behaving normally. Urine output has been normal. There were no sick contacts. He has received no recent medical care.    Past Medical History  Diagnosis Date  . Hypertension     Past Surgical History  Procedure Date  . Cardioversion   . Hernia repair     No family history on file.  History  Substance Use Topics  . Smoking status: Never Smoker   . Smokeless tobacco: Not on file  . Alcohol Use: No      Review of Systems  Constitutional: Negative for fever, chills, activity change, appetite change and fatigue.  HENT: Negative for congestion, sore throat, rhinorrhea, neck pain and neck stiffness.   Eyes: Negative for photophobia, redness and visual disturbance.  Respiratory: Positive  for shortness of breath (worse with exertion). Negative for cough, chest tightness, wheezing and stridor.        + orthopnea and + PND   Cardiovascular: Positive for orthopnea. Negative for chest pain, palpitations and leg swelling.  Gastrointestinal: Negative for nausea, vomiting, abdominal pain, diarrhea, constipation and blood in stool.  Genitourinary: Negative for dysuria, urgency, hematuria and flank pain.  Musculoskeletal: Negative for back pain.  Skin: Negative for rash and wound.  Neurological: Negative for dizziness, seizures, facial asymmetry, speech difficulty, weakness, light-headedness, numbness and headaches.  Psychiatric/Behavioral: Negative for confusion.  All other systems reviewed and are negative.    Allergies  Review of patient's allergies indicates no known allergies.  Home Medications  No current outpatient prescriptions on file.  BP 165/111  Pulse 90  Resp 28  Ht 6' (1.829 m)  Wt 190 lb (86.183 kg)  BMI 25.77 kg/m2  SpO2 100%  Physical Exam  Nursing note and vitals reviewed. Constitutional: He is oriented to person, place, and time. He appears well-developed and well-nourished.  Non-toxic appearance. No distress.  HENT:  Head: Normocephalic and atraumatic.  Mouth/Throat: Oropharynx is clear and moist.  Eyes: Conjunctivae and EOM are normal. Pupils are equal, round, and reactive to light. No scleral icterus.  Neck: Normal range of motion. Neck supple. JVD (7 cm) present.  Cardiovascular: Normal rate, regular rhythm, normal heart sounds and intact distal pulses.   No murmur heard. Pulmonary/Chest: Effort normal. No respiratory distress. He has no wheezes. He has rales (mild at bases posteriorly).  Abdominal: Soft. Bowel sounds are  normal. He exhibits no distension. There is no tenderness. There is no rebound and no guarding.  Musculoskeletal: He exhibits edema (3+ pitting edema in bilateral lower extremities).  Neurological: He is alert and oriented to  person, place, and time. He has normal strength. No cranial nerve deficit. GCS eye subscore is 4. GCS verbal subscore is 5. GCS motor subscore is 6.  Skin: Skin is warm and dry. No rash noted. He is not diaphoretic.  Psychiatric: He has a normal mood and affect.    ED Course  Procedures (including critical care time)  Labs Reviewed  BASIC METABOLIC PANEL - Abnormal; Notable for the following:    Sodium 132 (*)    Chloride 95 (*)    Glucose, Bld 103 (*)    GFR calc non Af Amer 68 (*)    GFR calc Af Amer 78 (*)    All other components within normal limits  PRO B NATRIURETIC PEPTIDE - Abnormal; Notable for the following:    Pro B Natriuretic peptide (BNP) 11106.0 (*)    All other components within normal limits  CBC - Abnormal; Notable for the following:    WBC 10.8 (*)    All other components within normal limits  DIFFERENTIAL - Abnormal; Notable for the following:    Neutro Abs 8.1 (*)    Monocytes Absolute 1.1 (*)    All other components within normal limits  PROTIME-INR - Abnormal; Notable for the following:    Prothrombin Time 16.6 (*)    All other components within normal limits  POCT I-STAT TROPONIN I  I-STAT TROPONIN I    ED ECG REPORT   Date: 01/14/2012  EKG Time: 4:28 PM  Rate: 89  Rhythm: normal sinus rhythm  Axis: right  Intervals:none  ST&T Change: New T-wave inversions compared to prior EKG evident in V6. Lead II, II, aVF with 0.9mm ST depression.   Narrative Interpretation: Sinus rhythm with new T wave inversions and slight ST depression in inferior leads.               Dg Chest 2 View  01/14/2012  *RADIOLOGY REPORT*  Clinical Data: Shortness of breath, hypertension  CHEST - 2 VIEW  Comparison: 08/19/2011  Findings: Enlargement of cardiac silhouette with pulmonary vascular congestion. Bibasilar effusions and atelectasis. No definite pulmonary infiltrate or pneumothorax. No acute osseous findings.  IMPRESSION: Enlargement of cardiac silhouette with  pulmonary vascular congestion. Bibasilar effusions and atelectasis. When compared to prior study, bibasilar changes are progressive.  Original Report Authenticated By: Lollie Marrow, M.D.     1. Acute congestive heart failure   2. Subtherapeutic international normalized ratio (INR)       MDM  56yo CM with PMH significant for atrial flutter and systolic heart failure who presents to the ED due to dyspnea and leg swelling. Has had worsening swelling of his legs over the last few weeks. Has had approximately 20lb weight loss over the last 2 weeks. Pt not compliant with his lasix because it keeps him awake at night urinating. No hx fever. Denies chest pain or palpitations. Likely mild-mod CHF. Will check labs and review CXR ordered in triage. Pt on coumadin for aflutter.   CXR with pulm vasc congestion as suspected. Giving lasix. No c/w PNA no hx fever or cough.   EKG with new TWIs laterally. Getting troponin is case CHF related to ischemia.   2nd dose lasix given IV.   At 6:20 PM pt reassessed having good urine output. Pt  on 2L o2 via nasal cannula sat 100%. Discontinued o2. Will see how pt feels on room air.   Pt with over 1.5L urine output. Pt feeling better and wants to go home. Room air sats normal. Pt ambulated on pulse ox sats 94% and above. Will d/c.        Verne Carrow, MD 01/14/12 2251

## 2012-01-14 NOTE — ED Notes (Signed)
All er pyxis are out of lasix.  Pharmacy called to send some to er.

## 2012-01-14 NOTE — ED Provider Notes (Signed)
I saw and evaluated the patient, reviewed the resident's note and I agree with the findings and plan. 56 year old, male, with a history of congestive heart failure.  Presents to the emergency department complaining of swelling in his legs and progressive dyspnea on exertion.  He states that he has not been taking his Lasix as he had been prescribed. He denies chest pain.  He has not had fevers, chills, cough.  His chest x-ray shows mild pulmonary edema.  We will perform laboratory testing, and given diuretics in the emergency department and then determine whether or not.  He needs to be admitted to the hospital based upon how he feels.  He is not in respiratory distress and he may be able to be discharged with followup with his physician.  Nicholes Stairs, MD 01/14/12 2044

## 2012-01-14 NOTE — ED Provider Notes (Signed)
I saw and evaluated the patient, reviewed the resident's note and I agree with the findings and plan.  Nicholes Stairs, MD 01/14/12 [phone]

## 2012-01-28 ENCOUNTER — Encounter (HOSPITAL_COMMUNITY): Payer: Self-pay | Admitting: Cardiology

## 2012-01-28 ENCOUNTER — Emergency Department (HOSPITAL_COMMUNITY): Payer: PRIVATE HEALTH INSURANCE

## 2012-01-28 ENCOUNTER — Inpatient Hospital Stay (HOSPITAL_COMMUNITY)
Admission: EM | Admit: 2012-01-28 | Discharge: 2012-01-31 | DRG: 292 | Disposition: A | Discharge status: Home / Self Care | Payer: PRIVATE HEALTH INSURANCE | Attending: Cardiology | Admitting: Cardiology

## 2012-01-28 DIAGNOSIS — Z9119 Patient's noncompliance with other medical treatment and regimen: Secondary | ICD-10-CM

## 2012-01-28 DIAGNOSIS — I10 Essential (primary) hypertension: Secondary | ICD-10-CM | POA: Diagnosis present

## 2012-01-28 DIAGNOSIS — I4892 Unspecified atrial flutter: Secondary | ICD-10-CM | POA: Insufficient documentation

## 2012-01-28 DIAGNOSIS — E039 Hypothyroidism, unspecified: Secondary | ICD-10-CM | POA: Diagnosis present

## 2012-01-28 DIAGNOSIS — I509 Heart failure, unspecified: Secondary | ICD-10-CM | POA: Diagnosis present

## 2012-01-28 DIAGNOSIS — Z91199 Patient's noncompliance with other medical treatment and regimen due to unspecified reason: Secondary | ICD-10-CM

## 2012-01-28 DIAGNOSIS — Z91148 Patient's other noncompliance with medication regimen for other reason: Secondary | ICD-10-CM

## 2012-01-28 DIAGNOSIS — Z7901 Long term (current) use of anticoagulants: Secondary | ICD-10-CM | POA: Insufficient documentation

## 2012-01-28 DIAGNOSIS — I428 Other cardiomyopathies: Secondary | ICD-10-CM | POA: Diagnosis present

## 2012-01-28 DIAGNOSIS — I5023 Acute on chronic systolic (congestive) heart failure: Principal | ICD-10-CM | POA: Diagnosis present

## 2012-01-28 DIAGNOSIS — Z9114 Patient's other noncompliance with medication regimen: Secondary | ICD-10-CM

## 2012-01-28 HISTORY — DX: Heart failure, unspecified: I50.9

## 2012-01-28 HISTORY — DX: Unspecified atrial flutter: I48.92

## 2012-01-28 LAB — URINE MICROSCOPIC-ADD ON

## 2012-01-28 LAB — URINALYSIS, ROUTINE W REFLEX MICROSCOPIC
Glucose, UA: NEGATIVE mg/dL
Specific Gravity, Urine: 1.019 (ref 1.005–1.030)
Urobilinogen, UA: 1 mg/dL (ref 0.0–1.0)

## 2012-01-28 LAB — COMPREHENSIVE METABOLIC PANEL
ALT: 23 U/L (ref 0–53)
Albumin: 4 g/dL (ref 3.5–5.2)
Alkaline Phosphatase: 156 U/L — ABNORMAL HIGH (ref 39–117)
Potassium: 4.5 mEq/L (ref 3.5–5.1)
Sodium: 135 mEq/L (ref 135–145)
Total Protein: 7.3 g/dL (ref 6.0–8.3)

## 2012-01-28 LAB — DIFFERENTIAL
Basophils Absolute: 0 10*3/uL (ref 0.0–0.1)
Basophils Relative: 0 % (ref 0–1)
Eosinophils Absolute: 0 10*3/uL (ref 0.0–0.7)
Neutrophils Relative %: 76 % (ref 43–77)

## 2012-01-28 LAB — PROTIME-INR: Prothrombin Time: 20.8 seconds — ABNORMAL HIGH (ref 11.6–15.2)

## 2012-01-28 LAB — POCT I-STAT TROPONIN I

## 2012-01-28 LAB — APTT: aPTT: 33 seconds (ref 24–37)

## 2012-01-28 LAB — PRO B NATRIURETIC PEPTIDE: Pro B Natriuretic peptide (BNP): 17868 pg/mL — ABNORMAL HIGH (ref 0–125)

## 2012-01-28 LAB — CBC
MCH: 29.1 pg (ref 26.0–34.0)
MCHC: 33.5 g/dL (ref 30.0–36.0)
Platelets: 339 10*3/uL (ref 150–400)

## 2012-01-28 LAB — TSH: TSH: 8.127 u[IU]/mL — ABNORMAL HIGH (ref 0.350–4.500)

## 2012-01-28 MED ORDER — ENOXAPARIN SODIUM 40 MG/0.4ML ~~LOC~~ SOLN
40.0000 mg | SUBCUTANEOUS | Status: DC
  Administered 2012-01-28 – 2012-01-30 (×3): 40 mg via SUBCUTANEOUS
  Filled 2012-01-28 (×4): qty 0.4

## 2012-01-28 MED ORDER — FUROSEMIDE 10 MG/ML IJ SOLN
40.0000 mg | Freq: Once | INTRAMUSCULAR | Status: AC
  Administered 2012-01-28: 40 mg via INTRAVENOUS
  Filled 2012-01-28: qty 4

## 2012-01-28 MED ORDER — SODIUM CHLORIDE 0.9 % IV SOLN
250.0000 mL | INTRAVENOUS | Status: DC | PRN
Start: 2012-01-28 — End: 2012-01-31

## 2012-01-28 MED ORDER — WARFARIN SODIUM 5 MG PO TABS
5.0000 mg | ORAL_TABLET | Freq: Once | ORAL | Status: AC
  Administered 2012-01-28: 5 mg via ORAL
  Filled 2012-01-28 (×2): qty 1

## 2012-01-28 MED ORDER — ONDANSETRON HCL 4 MG/2ML IJ SOLN: 4.0000 mg | Freq: Four times a day (QID) | INTRAMUSCULAR | Status: DC | PRN

## 2012-01-28 MED ORDER — POTASSIUM CHLORIDE CRYS ER 20 MEQ PO TBCR
20.0000 meq | EXTENDED_RELEASE_TABLET | Freq: Two times a day (BID) | ORAL | Status: DC
  Administered 2012-01-28 – 2012-01-31 (×7): 20 meq via ORAL
  Filled 2012-01-28 (×9): qty 1

## 2012-01-28 MED ORDER — SODIUM CHLORIDE 0.9 % IJ SOLN
3.0000 mL | Freq: Two times a day (BID) | INTRAMUSCULAR | Status: DC
Start: 2012-01-28 — End: 2012-01-31
  Administered 2012-01-28 – 2012-01-30 (×6): 3 mL via INTRAVENOUS

## 2012-01-28 MED ORDER — ISOSORB DINITRATE-HYDRALAZINE 20-37.5 MG PO TABS
1.0000 | ORAL_TABLET | Freq: Three times a day (TID) | ORAL | Status: DC
  Administered 2012-01-28 – 2012-01-31 (×9): 1 via ORAL
  Filled 2012-01-28 (×11): qty 1

## 2012-01-28 MED ORDER — FUROSEMIDE 10 MG/ML IJ SOLN
80.0000 mg | Freq: Three times a day (TID) | INTRAMUSCULAR | Status: DC
  Administered 2012-01-28 – 2012-01-29 (×3): 80 mg via INTRAVENOUS
  Filled 2012-01-28 (×6): qty 8

## 2012-01-28 MED ORDER — ACETAMINOPHEN 325 MG PO TABS: 650.0000 mg | ORAL_TABLET | ORAL | Status: DC | PRN

## 2012-01-28 MED ORDER — AMIODARONE HCL 200 MG PO TABS
200.0000 mg | ORAL_TABLET | Freq: Every day | ORAL | Status: DC
  Administered 2012-01-28 – 2012-01-31 (×4): 200 mg via ORAL
  Filled 2012-01-28 (×4): qty 1

## 2012-01-28 MED ORDER — LORAZEPAM 0.5 MG PO TABS: 1.0000 mg | ORAL_TABLET | Freq: Three times a day (TID) | ORAL | Status: DC | PRN

## 2012-01-28 MED ORDER — SODIUM CHLORIDE 0.9 % IJ SOLN: 3.0000 mL | INTRAMUSCULAR | Status: DC | PRN

## 2012-01-28 MED ORDER — METOPROLOL SUCCINATE ER 25 MG PO TB24
25.0000 mg | ORAL_TABLET | Freq: Every day | ORAL | Status: DC
  Administered 2012-01-28 – 2012-01-31 (×4): 25 mg via ORAL
  Filled 2012-01-28 (×4): qty 1

## 2012-01-28 MED ORDER — LISINOPRIL 20 MG PO TABS
20.0000 mg | ORAL_TABLET | Freq: Every day | ORAL | Status: DC
  Administered 2012-01-28 – 2012-01-31 (×4): 20 mg via ORAL
  Filled 2012-01-28 (×4): qty 1

## 2012-01-28 NOTE — ED Notes (Signed)
Patient  C/O shortness of breath for 3 days. Today his dyspnea was worse so he came in to the ED. Denies pain, just C/O dyspnea. Breath sounds are distant in bases. ++ edema of bilateral LE. Patient C/O difficulty voiding also. Room air SaO2 is 98%, Respiratory rate is 22 to 24 breaths per minute. Placed on O2 at 2 L/M per Cannula.

## 2012-01-28 NOTE — ED Notes (Signed)
Dr Anne Fu in for pt eval and admit

## 2012-01-28 NOTE — ED Notes (Signed)
Foley catheter inserted with no resistance.  Dark yellow urine returned

## 2012-01-28 NOTE — Progress Notes (Signed)
ANTICOAGULATION CONSULT NOTE - Initial Consult  Pharmacy Consult for coumadin Indication: atrial fibrillation  No Known Allergies  Patient Measurements: Height: 6' (182.9 cm) Weight: 184 lb (83.462 kg) IBW/kg (Calculated) : 77.6  Heparin Dosing Weight:   Vital Signs: Temp: 98.1 F (36.7 C) (02/01 1317) Temp src: Oral (02/01 1317) BP: 163/110 mmHg (02/01 1317) Pulse Rate: 80  (02/01 1317)  Labs:  Basename 01/28/12 1236  HGB 15.4  HCT 46.0  PLT 339  APTT 33  LABPROT 20.8*  INR 1.76*  HEPARINUNFRC --  CREATININE --  CKTOTAL --  CKMB --  TROPONINI --   Estimated Creatinine Clearance: 77.9 ml/min (by C-G formula based on Cr of 1.19).  Medical History: Past Medical History  Diagnosis Date  . Hypertension   . CHF (congestive heart failure)   . Atrial flutter     Medications:  Scheduled:    . amiodarone  200 mg Oral Daily  . enoxaparin  40 mg Subcutaneous Q24H  . furosemide  40 mg Intravenous Once  . furosemide  80 mg Intravenous Q8H  . isosorbide-hydrALAZINE  1 tablet Oral TID  . lisinopril  20 mg Oral Daily  . metoprolol succinate  25 mg Oral Daily  . potassium chloride  20 mEq Oral BID  . sodium chloride  3 mL Intravenous Q12H   Infusions:    Assessment: 56 yo male with hx of afib will be continued on coumadin.  INR today was 1.76.  Home coumadin was 2.5mg  po qday (last dose on 01/27/12) Goal of Therapy:  INR 2-3   Plan:  1) Coumadin 5mg  po x1 tonight 2) Daily PT/INR  Gaylen Venning, Tsz-Yin 01/28/2012,1:20 PM

## 2012-01-28 NOTE — ED Notes (Signed)
MD at bedside. Dr. Skains 

## 2012-01-28 NOTE — ED Notes (Signed)
Report called to [phone] 

## 2012-01-28 NOTE — H&P (Signed)
Admit date: 01/28/2012 Primary Cardiologist  Khan Chura  CC: Shortness of breath   HPI: 56 year old with cardiomyopathy prior EF 20% presumed tachycardia/HTN induced, here with 2 weeks of progressive SOB. Non compliant. +Orthopnea, also "trouble" voiding. Requesting foley. No fever, nausea, cough, chills, rash, chest pain.   Saw him last week in clinic. He was asked to be admitted at that time. Did not comply. He also did not show back for follow up appt.   This is similar to prior episode of heart failure however rhythm currently is sinus. Prior exacerbations were in periods of tachycardia/afib and required cardioversion.   He lives with his mother.   CXR with right pleural effusion and edema.   BNP last week 11000.   I asked him to please come into the hospital and he is willing "for a short time" and is requesting a Foley.    PMH:   Past Medical History  Diagnosis Date  . Hypertension   . CHF (congestive heart failure)     PSH:   Past Surgical History  Procedure Date  . Cardioversion   . Hernia repair    Allergies:  Review of patient's allergies indicates no known allergies. Prior to Admit Meds:   (Not in a hospital admission) Fam HX:   No family history on file.no early history of CAD or HF Social HX:    History   Social History  . Marital Status: Single    Spouse Name: N/A    Number of Children: N/A  . Years of Education: N/A   Occupational History  . Not on file.   Social History Main Topics  . Smoking status: Never Smoker   . Smokeless tobacco: Not on file  . Alcohol Use: No  . Drug Use: No  . Sexually Active:    Other Topics Concern  . Not on file   Social History Narrative  . No narrative on file     ROS:  All 11 ROS were addressed and are negative except what is stated in the HPI  Physical Exam: Blood pressure 161/116, pulse 85, resp. rate 20, height 6' (1.829 m), weight 83.462 kg (184 lb), SpO2 98.00%.    General: Well developed, well nourished,  in no acute distress Head: Eyes PERRLA, No xanthomas.   Normal cephalic and atramatic  Lungs:  Decreased BS RLL, scattered crackles.  Heart:  HRRR S1 S2 Pulses are 2+ & equal. No carotid bruit. moderate JVD to jaw line.  No abdominal bruits. No femoral bruits. Abdomen: Bowel sounds are positive, abdomen soft and non-tender without masses or   Hernia's noted. No hepatosplenomegaly. Msk:  Back normal, normal gait. Normal strength and tone for age. Extremities:  4+ edema BLE Neuro: Alert and oriented X 3, non-focal, MAE x 4, anxious, affect GU: Deferred Rectal: Deferred Psych:  anxious    Labs:   Lab Results  Component Value Date   WBC 10.8* 01/14/2012   HGB 14.9 01/14/2012   HCT 44.9 01/14/2012   MCV 87.2 01/14/2012   PLT 315 01/14/2012   No results found for this basename: NA,K,CL,CO2,BUN,CREATININE,CALCIUM,LABALBU,PROT,BILITOT,ALKPHOS,ALT,AST,GLUCOSE in the last 168 hours No results found for this basename: PTT   Lab Results  Component Value Date   INR 1.32 01/14/2012   INR 3.61* 08/22/2011   INR 4.61* 08/21/2011   Lab Results  Component Value Date   CKTOTAL 27 08/20/2011   CKMB 2.4 08/20/2011   TROPONINI <0.30 08/20/2011      Radiology:  Right pleural  effusion, edema B.  Personally viewed.   EKG:  Tele in SR. Personally viewed.   ASSESSMENT/PLAN:   56 year old with acute on chronic systolic heart failure, prior atrial flutter (cardioversion x 2 over the past 4 months), non compliant, right pleural effusion,  supposed to be on chronic anticoagulation.   -Lasix 80IV Q8. -Foley -Restart meds. Will place on Toprol 25 QD, Lisinopril 20 QD, and start Bi-Dil BID 20/37.5mg  because of elevated BP.  -Restart amiodarone because of prior flutter - 200mg  QD -Restart coumadin per pharmacy -Repeat ECHO (he has been in SR last week and today and if purely tachycardia induced cardiomyopathy I would expect resolution of EF but apparently this has not happened.  -Interestingly, on last 2  admits, BP was low. Now quite elevated.  -If EF remains low despite medication compliance, he deserves a cardiac cath to exclude CAD. Recheck TSH, BNP.  Donato Schultz, MD  01/28/2012  1:00 PM

## 2012-01-28 NOTE — ED Notes (Signed)
Was seen by dr office last week and they wanted to put him in the hospital to get some fluid off  and he did not want to stay . Now more sob  Worse last 2-3 days speaking in shortsentences when exerted

## 2012-01-28 NOTE — Progress Notes (Signed)
Report received from ED, patient has arrived on unit, VS and patient are stable___________________D. Manson Passey RN

## 2012-01-28 NOTE — ED Provider Notes (Signed)
History     CSN: [id number]  Arrival date & time 01/28/12  1158   First MD Initiated Contact with Patient 01/28/12 1208      Chief Complaint  Patient presents with  . Shortness of Breath    (Consider location/radiation/quality/duration/timing/severity/associated sxs/prior treatment) HPI Comments: Hx CHF. Has not been compliant with his medications. Was seen in the emergency department on 1/18 RV was diuresed. Followup with his primary care physician in one of admitted the hospital however he did not oblige. He has mild associated chest pain  Patient is a 56 y.o. male presenting with shortness of breath. The history is provided by the patient. No language interpreter was used.  Shortness of Breath  The current episode started 2 days ago. The onset was gradual. The problem occurs continuously. The problem has been gradually worsening. The problem is moderate. The symptoms are relieved by nothing. The symptoms are aggravated by a supine position (exertion). Associated symptoms include chest pain and shortness of breath. Pertinent negatives include no fever, no rhinorrhea, no sore throat, no cough and no wheezing.    Past Medical History  Diagnosis Date  . Hypertension     Past Surgical History  Procedure Date  . Cardioversion   . Hernia repair     No family history on file.  History  Substance Use Topics  . Smoking status: Never Smoker   . Smokeless tobacco: Not on file  . Alcohol Use: No      Review of Systems  Constitutional: Negative for fever, activity change, appetite change and fatigue.  HENT: Negative for congestion, sore throat, rhinorrhea, neck pain and neck stiffness.   Respiratory: Positive for shortness of breath. Negative for cough, chest tightness and wheezing.   Cardiovascular: Positive for chest pain and leg swelling. Negative for palpitations.  Gastrointestinal: Negative for nausea, vomiting and abdominal pain.  Genitourinary: Negative for dysuria,  urgency, frequency and flank pain.  Neurological: Negative for dizziness, weakness, light-headedness, numbness and headaches.  All other systems reviewed and are negative.    Allergies  Review of patient's allergies indicates no known allergies.  Home Medications   Current Outpatient Rx  Name Route Sig Dispense Refill  . FUROSEMIDE 40 MG PO TABS Oral Take 40 mg by mouth daily.    . WARFARIN SODIUM 5 MG PO TABS Oral Take 2.5 mg by mouth daily.      BP 161/116  Pulse 85  Resp 20  Ht 6' (1.829 m)  Wt 184 lb (83.462 kg)  BMI 24.95 kg/m2  SpO2 98%  Physical Exam  Nursing note and vitals reviewed. Constitutional: He is oriented to person, place, and time. He appears well-developed and well-nourished. No distress.  HENT:  Head: Normocephalic.  Mouth/Throat: Oropharynx is clear and moist.  Eyes: Conjunctivae and EOM are normal. Pupils are equal, round, and reactive to light.  Neck: Normal range of motion. Neck supple.  Cardiovascular: Normal rate, regular rhythm, normal heart sounds and intact distal pulses.  Exam reveals no gallop and no friction rub.   No murmur heard. Pulmonary/Chest: He is in respiratory distress (mild tachypnea when talking). He has rales (bases). He exhibits no tenderness.  Abdominal: Soft. Bowel sounds are normal. There is no tenderness.  Musculoskeletal: Normal range of motion. He exhibits edema (4+ pitting edema). He exhibits no tenderness.  Neurological: He is alert and oriented to person, place, and time. No cranial nerve deficit.  Skin: Skin is warm. No rash noted.    ED Course  Procedures (  including critical care time)   Date: 01/28/2012  Rate: 90  Rhythm: normal sinus rhythm  QRS Axis: normal  Intervals: normal  ST/T Wave abnormalities: nonspecific T wave changes  Conduction Disutrbances:none  Narrative Interpretation:   Old EKG Reviewed: unchanged   Labs Reviewed  CBC  DIFFERENTIAL  COMPREHENSIVE METABOLIC PANEL  PROTIME-INR    URINALYSIS, ROUTINE W REFLEX MICROSCOPIC  PRO B NATRIURETIC PEPTIDE  APTT  TSH  PRO B NATRIURETIC PEPTIDE   No results found.   1. CHF exacerbation       MDM  CHF exacerbation. Laboratory studies were ordered a chest x-ray was ordered. I administered 40 mg of IV Lasix. To be admitted by Dr.Skains his cardiologist.        Dayton Bailiff, MD 01/28/12 1255

## 2012-01-29 LAB — BASIC METABOLIC PANEL
BUN: 24 mg/dL — ABNORMAL HIGH (ref 6–23)
Calcium: 9.1 mg/dL (ref 8.4–10.5)
Creatinine, Ser: 1.05 mg/dL (ref 0.50–1.35)
GFR calc Af Amer: >90 mL/min (ref >90)
GFR calc non Af Amer: 79 mL/min — ABNORMAL LOW (ref >90)
Glucose, Bld: 78 mg/dL (ref 70–99)
Potassium: 3.6 mEq/L (ref 3.5–5.1)

## 2012-01-29 MED ORDER — WARFARIN SODIUM 5 MG PO TABS
5.0000 mg | ORAL_TABLET | Freq: Once | ORAL | Status: AC
  Administered 2012-01-29: 5 mg via ORAL
  Filled 2012-01-29: qty 1

## 2012-01-29 MED ORDER — FUROSEMIDE 10 MG/ML IJ SOLN
40.0000 mg | Freq: Two times a day (BID) | INTRAMUSCULAR | Status: DC
  Administered 2012-01-29 – 2012-01-30 (×3): 40 mg via INTRAVENOUS
  Filled 2012-01-29 (×6): qty 4

## 2012-01-29 MED ORDER — LEVOTHYROXINE SODIUM 25 MCG PO TABS
25.0000 ug | ORAL_TABLET | Freq: Every day | ORAL | Status: DC
  Administered 2012-01-30 – 2012-01-31 (×2): 25 ug via ORAL
  Filled 2012-01-29 (×3): qty 1

## 2012-01-29 NOTE — Progress Notes (Signed)
*  PRELIMINARY RESULTS* Echocardiogram 2D Echocardiogram has been performed.  Clide Deutscher 01/29/2012, 10:05 AM

## 2012-01-29 NOTE — Progress Notes (Signed)
Patient assessed by [facility] nursing student, Desirae Wallene Dales. Documentation witnessed by this [facility] nursing instructor.

## 2012-01-29 NOTE — Progress Notes (Signed)
ANTICOAGULATION CONSULT NOTE - Initial Consult  Pharmacy Consult for coumadin Indication: atrial fibrillation  No Known Allergies  Patient Measurements: Height: 6' (182.9 cm) Weight: 164 lb 12.8 oz (74.753 kg) (scale b) IBW/kg (Calculated) : 77.6  Heparin Dosing Weight:   Vital Signs: Temp: 97.3 F (36.3 C) (02/02 0549) Temp src: Oral (02/02 0549) BP: 118/90 mmHg (02/02 1013) Pulse Rate: 69  (02/02 0549)  Labs:  Basename 01/29/12 0615 01/28/12 1236  HGB -- 15.4  HCT -- 46.0  PLT -- 339  APTT -- 33  LABPROT 21.8* 20.8*  INR 1.86* 1.76*  HEPARINUNFRC -- --  CREATININE 1.05 1.16  CKTOTAL -- --  CKMB -- --  TROPONINI -- --   Estimated Creatinine Clearance: 85.1 ml/min (by C-G formula based on Cr of 1.05).  Medical History: Past Medical History  Diagnosis Date  . Hypertension   . CHF (congestive heart failure)   . Atrial flutter     Medications:  Scheduled:     . amiodarone  200 mg Oral Daily  . enoxaparin  40 mg Subcutaneous Q24H  . furosemide  40 mg Intravenous Once  . furosemide  40 mg Intravenous BID  . isosorbide-hydrALAZINE  1 tablet Oral TID  . levothyroxine  25 mcg Oral QAC breakfast  . lisinopril  20 mg Oral Daily  . metoprolol succinate  25 mg Oral Daily  . potassium chloride  20 mEq Oral BID  . sodium chloride  3 mL Intravenous Q12H  . warfarin  5 mg Oral ONCE-1800  . DISCONTD: furosemide  80 mg Intravenous Q8H   Infusions:    Assessment: 54 yoM with prior aflutter (cardioversion x2 over last 4 months) and hx non-compliance with medications to be started on Coumadin per RX for afib. Also on LMWH 40mg  q 24h. Pt received 1 dose Coumadin 5mg  po last night. INR 1.76-->1.86   Hgb, SCr stable.  No bleeding noted.  Home coumadin 2.5mg  daily (?compliance).    Goal of Therapy:  INR 2-3   Plan:  1) Coumadin 5mg  po x1 tonight 2) F/u Daily PT/INR   Haynes Hoehn, PharmD 01/29/2012 11:16 AM  Pager: [phone]

## 2012-01-29 NOTE — Progress Notes (Signed)
SUBJECTIVE:  Breathing better this am but legs still swollen OBJECTIVE:   Vitals:   Filed Vitals:   01/28/12 1745 01/28/12 2143 01/29/12 0549 01/29/12 1013  BP: 160/119 120/81 112/65 118/90  Pulse: 82 77 69   Temp: 97.4 F (36.3 C) 98.3 F (36.8 C) 97.3 F (36.3 C)   TempSrc: Oral Oral Oral   Resp: 20 20 20    Height:      Weight:   74.753 kg (164 lb 12.8 oz)   SpO2: 98% 94% 97%    I&O's:   Intake/Output Summary (Last 24 hours) at 01/29/12 1029 Last data filed at 01/29/12 1000  Gross per 24 hour  Intake    491 ml  Output  47829 ml  Net -10759 ml   TELEMETRY: Reviewed telemetry pt in NSR:     PHYSICAL EXAM General: Well developed, well nourished, in no acute distress Head: Eyes PERRLA, No xanthomas.   Normal cephalic and atramatic  Lungs:   Clear bilaterally to auscultation and percussion. Heart:   HRRR S1 S2 Pulses are 2+ & equal.            No carotid bruit. No JVD.  No abdominal bruits. No femoral bruits. Abdomen: Bowel sounds are positive, abdomen soft and non-tender without masses  Extremities: 3+ pitting edema of LE bilaterally   Neuro: Alert and oriented X 3. Psych:  Good affect, responds appropriately   LABS: Basic Metabolic Panel:  Basename 01/29/12 0615 01/28/12 1236  NA 142 135  K 3.6 4.5  CL 101 97  CO2 29 24  GLUCOSE 78 114*  BUN 24* 26*  CREATININE 1.05 1.16  CALCIUM 9.1 10.1  MG -- --  PHOS -- --   Liver Function Tests:  Basename 01/28/12 1236  AST 26  ALT 23  ALKPHOS 156*  BILITOT 0.9  PROT 7.3  ALBUMIN 4.0    CBC:  Basename 01/28/12 1236  WBC 8.1  NEUTROABS 6.2  HGB 15.4  HCT 46.0  MCV 87.0  PLT 339   Thyroid Function Tests:  Basename 01/28/12 1236  TSH 8.127*  T4TOTAL --  T3FREE --  THYROIDAB --     Coag Panel:   Lab Results  Component Value Date   INR 1.86* 01/29/2012   INR 1.76* 01/28/2012   INR 1.32 01/14/2012    RADIOLOGY: Dg Chest 2 View  01/28/2012  *RADIOLOGY REPORT*  Clinical Data: Fluid retention.  Leg  swelling.  CHEST - 2 VIEW  Comparison: 01/14/2012.  Findings: Cardiomegaly.  Bilateral right greater than left pleural effusions.  Basilar atelectasis.  Pulmonary vascular congestion is present.  Underlying emphysema and bilateral pleural apical scarring.  Compared to 01/14/2012, little interval change.  IMPRESSION: Cardiomegaly, pulmonary vascular congestion and right greater than left pleural effusions with atelectasis.  Findings suggestive of CHF.  Original Report Authenticated By: Andreas Newport, M.D.   Dg Chest 2 View  01/14/2012  *RADIOLOGY REPORT*  Clinical Data: Shortness of breath, hypertension  CHEST - 2 VIEW  Comparison: 08/19/2011  Findings: Enlargement of cardiac silhouette with pulmonary vascular congestion. Bibasilar effusions and atelectasis. No definite pulmonary infiltrate or pneumothorax. No acute osseous findings.  IMPRESSION: Enlargement of cardiac silhouette with pulmonary vascular congestion. Bibasilar effusions and atelectasis. When compared to prior study, bibasilar changes are progressive.  Original Report Authenticated By: Lollie Marrow, M.D.      ASSESSMENT:  1.  Acute on Chronic systolic heart failure with marked diuresis last PM but still with significant LE edema 2.  Atrial  flutter with cardioversion X 2 in last 4 months maintaining NSR 3.  Hypothyroidism with increased TSH may be contributing to CHF 4.  Medical noncompliance - amio/ACEI/Bidil and beta blocker restarted 5.  Hypertension 6.  History of tachycardia induced cardiomyopathy  PLAN:   1.  Continue amiodarone 2.  Decrease Lasix to 40mg  IV BID 3.  BNP and BMET in am 4.  Continue BiDIL/ACE I/beta blocker 5.  Synthroid daily 6.  Check full thyroid panel  Quintella Reichert, MD  01/29/2012  10:29 AM

## 2012-01-30 LAB — BASIC METABOLIC PANEL
BUN: 22 mg/dL (ref 6–23)
Chloride: 102 mEq/L (ref 96–112)
GFR calc non Af Amer: 81 mL/min — ABNORMAL LOW (ref >90)
Glucose, Bld: 81 mg/dL (ref 70–99)
Potassium: 3.7 mEq/L (ref 3.5–5.1)
Sodium: 140 mEq/L (ref 135–145)

## 2012-01-30 LAB — PRO B NATRIURETIC PEPTIDE: Pro B Natriuretic peptide (BNP): 2138 pg/mL — ABNORMAL HIGH (ref 0–125)

## 2012-01-30 LAB — PROTIME-INR: INR: 2.27 — ABNORMAL HIGH (ref 0.00–1.49)

## 2012-01-30 MED ORDER — WARFARIN SODIUM 2.5 MG PO TABS
2.5000 mg | ORAL_TABLET | Freq: Once | ORAL | Status: AC
  Administered 2012-01-30: 2.5 mg via ORAL
  Filled 2012-01-30: qty 1

## 2012-01-30 NOTE — Progress Notes (Signed)
I sat with pt on numerous occassions to discuss dx of chf and to teach about chf. Pt very resistant to teaching. Has preforrmed ideas that i was unable to change. i do not feel that he understands about chf .

## 2012-01-30 NOTE — Progress Notes (Signed)
ANTICOAGULATION CONSULT NOTE - Follow Up Consult  Pharmacy Consult for Coumadin Indication: atrial fibrillation  No Known Allergies  Patient Measurements: Height: 6' (182.9 cm) Weight: 160 lb 4.8 oz (72.712 kg) IBW/kg (Calculated) : 77.6  Heparin Dosing Weight:   Vital Signs: Temp: 97.8 F (36.6 C) (02/03 0519) Temp src: Oral (02/03 0519) BP: 118/74 mmHg (02/03 0519) Pulse Rate: 76  (02/03 0519)  Labs:  Basename 01/30/12 0605 01/29/12 0615 01/28/12 1236  HGB -- -- 15.4  HCT -- -- 46.0  PLT -- -- 339  APTT -- -- 33  LABPROT 25.4* 21.8* 20.8*  INR 2.27* 1.86* 1.76*  HEPARINUNFRC -- -- --  CREATININE 1.03 1.05 1.16  CKTOTAL -- -- --  CKMB -- -- --  TROPONINI -- -- --   Estimated Creatinine Clearance: 84.3 ml/min (by C-G formula based on Cr of 1.03).   Assessment: 34 yoM with prior aflutter (cardioversion x2 over last 4 months) and hx non-compliance with medications restarted on home Coumadin per RX for afib.  Pt also on LMWH 40mg  q 24 hours.  INR therapeutic 1.86-->2.27.  No bleeding noted.  Home dose coumadin 2.5mg  daily, though with questionable compliance.  Renal fxn stable.    Goal of Therapy:  INR 2-3   Plan:  1. Coumadin 2.5mg  po x 1 2.  F/u PT/INR, CBC   Sheilyn Boehlke E 01/30/2012,11:17 AM

## 2012-01-30 NOTE — Progress Notes (Signed)
SUBJECTIVE:  Feels much better  OBJECTIVE:   Vitals:   Filed Vitals:   01/29/12 1013 01/29/12 1400 01/29/12 2046 01/30/12 0519  BP: 118/90 106/68 115/80 118/74  Pulse:  70 70 76  Temp:  97.6 F (36.4 C) 97.7 F (36.5 C) 97.8 F (36.6 C)  TempSrc:  Oral  Oral  Resp:  20 20 18   Height:      Weight:    72.712 kg (160 lb 4.8 oz)  SpO2:  98% 96% 97%   I&O's:   Intake/Output Summary (Last 24 hours) at 01/30/12 1000 Last data filed at 01/30/12 6578  Gross per 24 hour  Intake    104 ml  Output   2875 ml  Net  -2771 ml   TELEMETRY: Reviewed telemetry pt in NSR     PHYSICAL EXAM General: Well developed, well nourished, in no acute distress Head: Eyes PERRLA, No xanthomas.   Normal cephalic and atramatic  Lungs:   Clear bilaterally to auscultation and percussion. Heart:   HRRR S1 S2 Pulses are 2+ & equal.            No carotid bruit. No JVD.  No abdominal bruits. No femoral bruits. Abdomen: Bowel sounds are positive, abdomen soft and non-tender without masses or                  Hernia's noted. Msk:  Back normal, normal gait. Normal strength and tone for age. Extremities:   2+ edema bilaterally Neuro: Alert and oriented X 3. Psych:  Good affect, responds appropriately   LABS: Basic Metabolic Panel:  Basename 01/30/12 0605 01/29/12 0615  NA 140 142  K 3.7 3.6  CL 102 101  CO2 29 29  GLUCOSE 81 78  BUN 22 24*  CREATININE 1.03 1.05  CALCIUM 8.9 9.1  MG -- --  PHOS -- --   Liver Function Tests:  Basename 01/28/12 1236  AST 26  ALT 23  ALKPHOS 156*  BILITOT 0.9  PROT 7.3  ALBUMIN 4.0    CBC:  Basename 01/28/12 1236  WBC 8.1  NEUTROABS 6.2  HGB 15.4  HCT 46.0  MCV 87.0  PLT 339    Basename 01/28/12 1236  TSH 8.127*  T4TOTAL --  T3FREE --  THYROIDAB --    Coag Panel:   Lab Results  Component Value Date   INR 2.27* 01/30/2012   INR 1.86* 01/29/2012   INR 1.76* 01/28/2012    RADIOLOGY: Dg Chest 2 View  01/28/2012  *RADIOLOGY REPORT*  Clinical  Data: Fluid retention.  Leg swelling.  CHEST - 2 VIEW  Comparison: 01/14/2012.  Findings: Cardiomegaly.  Bilateral right greater than left pleural effusions.  Basilar atelectasis.  Pulmonary vascular congestion is present.  Underlying emphysema and bilateral pleural apical scarring.  Compared to 01/14/2012, little interval change.  IMPRESSION: Cardiomegaly, pulmonary vascular congestion and right greater than left pleural effusions with atelectasis.  Findings suggestive of CHF.  Original Report Authenticated By: Andreas Newport, M.D.   Dg Chest 2 View  01/14/2012  *RADIOLOGY REPORT*  Clinical Data: Shortness of breath, hypertension  CHEST - 2 VIEW  Comparison: 08/19/2011  Findings: Enlargement of cardiac silhouette with pulmonary vascular congestion. Bibasilar effusions and atelectasis. No definite pulmonary infiltrate or pneumothorax. No acute osseous findings.  IMPRESSION: Enlargement of cardiac silhouette with pulmonary vascular congestion. Bibasilar effusions and atelectasis. When compared to prior study, bibasilar changes are progressive.  Original Report Authenticated By: Lollie Marrow, M.D.    ASSESSMENT:  1. Acute  on Chronic systolic heart failure with marked diuresis  but still with significant LE edema although much improved 2. Atrial flutter with cardioversion X 2 in last 4 months maintaining NSR  3. Hypothyroidism with increased TSH may be contributing to CHF  4. Medical noncompliance - amio/ACEI/Bidil and beta blocker restarted  5. Hypertension  6. History of tachycardia induced cardiomyopathy      PLAN:   1.  Continue IV diuretics 1 more day then switch to PO 2.  BMET in am  Quintella Reichert, MD  01/30/2012  10:00 AM

## 2012-01-31 LAB — PROTIME-INR
INR: 1.81 — ABNORMAL HIGH (ref 0.00–1.49)
Prothrombin Time: 21.3 seconds — ABNORMAL HIGH (ref 11.6–15.2)

## 2012-01-31 LAB — BASIC METABOLIC PANEL
CO2: 30 mEq/L (ref 19–32)
Calcium: 8.9 mg/dL (ref 8.4–10.5)
GFR calc non Af Amer: 82 mL/min — ABNORMAL LOW (ref >90)
Potassium: 4 mEq/L (ref 3.5–5.1)
Sodium: 138 mEq/L (ref 135–145)

## 2012-01-31 MED ORDER — POTASSIUM CHLORIDE CRYS ER 20 MEQ PO TBCR: 20.0000 meq | EXTENDED_RELEASE_TABLET | Freq: Every day | ORAL | Status: DC

## 2012-01-31 MED ORDER — METOPROLOL SUCCINATE ER 25 MG PO TB24: 25.0000 mg | ORAL_TABLET | Freq: Every day | ORAL | Status: DC

## 2012-01-31 MED ORDER — FUROSEMIDE 40 MG PO TABS: 40.0000 mg | ORAL_TABLET | Freq: Two times a day (BID) | ORAL | Status: DC

## 2012-01-31 MED ORDER — LISINOPRIL 20 MG PO TABS: 20.0000 mg | ORAL_TABLET | Freq: Every day | ORAL | Status: DC

## 2012-01-31 MED ORDER — AMIODARONE HCL 200 MG PO TABS: 200.0000 mg | ORAL_TABLET | Freq: Every day | ORAL | Status: DC

## 2012-01-31 MED ORDER — FUROSEMIDE 40 MG PO TABS: 40.0000 mg | ORAL_TABLET | Freq: Every day | ORAL | Status: DC

## 2012-01-31 MED ORDER — ISOSORB DINITRATE-HYDRALAZINE 20-37.5 MG PO TABS: 1.0000 | ORAL_TABLET | Freq: Two times a day (BID) | ORAL | Status: AC

## 2012-01-31 NOTE — Progress Notes (Signed)
UR Completed. Simmons, Elverna Caffee F [phone]  

## 2012-01-31 NOTE — Discharge Summary (Signed)
Patient ID: Corey [name] MRN: [id number] DOB/AGE: 55/05/1956 55 y.o.  Admit date: 01/28/2012 Discharge date: 01/31/2012  Primary Discharge Diagnosis: Acute on chronic systolic heart failure  Secondary Discharge Diagnosis:  Cardiomyopathy-?etiology, presume NICM (no cath) EF 35% from 20%.   Atrial flutter- on amiodarone - NSR during hospital stay  Medical non- compliance  Chronic anticoagulation - [facility] cardio)   Significant Diagnostic Studies: ECHO EF 35% from 20%  Hospital Course: Admit weight 86.1kg, Discharge weight 72.5kg.  Admit BNP: 17800 Discharge: 2100  Requested foley through admit. No CP. LE edema was 4+ now 1-2+. Ambulating well. Had CHF teaching but has "preformed" ideas about HF and does not fully understand. Feels much better. Net out 16.7L.  R>L pleural effusion on CXR.  Stayed in NSR through admit. Ready to go home.   TSH was mildly elevated but T4 was mildly elevated 2.14.   Tried to teach. No salt. Decrease fluids, Weigh daily.   Protein in urine. May need 24 hour collection. Creat nl.   BP on admit 180/120 - after lasix and meds normal. New med is Bidil.   Discharge Exam: Blood pressure 125/85, pulse 78, temperature 98.7 F (37.1 C), temperature source Oral, resp. rate 20, height 6' (1.829 m), weight 72.5 kg (159 lb 13.3 oz), SpO2 98.00%.    GEN: AAO x3 in NAD CV: RRR no m HEENT: decreased JVD from admit LUNGS: decreased BS BLL ABD: soft +BS GU: Foley EXT: 1-2+ edema - "wrinkled" LE  Labs:   Lab Results  Component Value Date   WBC 8.1 01/28/2012   HGB 15.4 01/28/2012   HCT 46.0 01/28/2012   MCV 87.0 01/28/2012   PLT 339 01/28/2012    Lab 01/31/12 0545 01/28/12 1236  NA 138 --  K 4.0 --  CL 100 --  CO2 30 --  BUN 20 --  CREATININE 1.01 --  CALCIUM 8.9 --  PROT -- 7.3  BILITOT -- 0.9  ALKPHOS -- 156*  ALT -- 23  AST -- 26  GLUCOSE 78 --   Lab Results  Component Value Date   CKTOTAL 27 08/20/2011   CKMB 2.4  08/20/2011   TROPONINI <0.30 08/20/2011    FOLLOW UP PLANS AND APPOINTMENTS Discharge Orders    Future Orders Please Complete By Expires   Diet - low sodium heart healthy      Increase activity slowly      Care order/instruction      Comments:   Remove foley     Medication List  As of 01/31/2012  8:24 AM   TAKE these medications         amiodarone 200 MG tablet   Commonly known as: PACERONE   Take 1 tablet (200 mg total) by mouth daily.      furosemide 40 MG tablet   Commonly known as: LASIX   Take 1 tablet (40 mg total) by mouth daily.      furosemide 40 MG tablet   Commonly known as: LASIX   Take 1 tablet (40 mg total) by mouth 2 (two) times daily.      isosorbide-hydrALAZINE 20-37.5 MG per tablet   Commonly known as: BIDIL   Take 1 tablet by mouth 2 (two) times daily.      lisinopril 20 MG tablet   Commonly known as: PRINIVIL,ZESTRIL   Take 1 tablet (20 mg total) by mouth daily.      metoprolol succinate 25 MG 24 hr tablet   Commonly  known as: TOPROL-XL   Take 1 tablet (25 mg total) by mouth daily.      potassium chloride SA 20 MEQ tablet   Commonly known as: K-DUR,KLOR-CON   Take 1 tablet (20 mEq total) by mouth daily.      warfarin 5 MG tablet   Commonly known as: COUMADIN   Take 2.5 mg by mouth daily.           Follow-up Information    Follow up with FERGUSON,CYNTHIA A, NP on 02/07/2012. (910am)    Contact information:   [facility] [address], [address] [place] [address] [phone]         He is also due for INR tomorrow with Alfonse Ras at the lab.   BRING ALL MEDICATIONS WITH YOU TO FOLLOW UP APPOINTMENTS  Time spent with patient to include physician time:40, teaching, med rec, labs.  SignedDonato Schultz 01/31/2012, 8:24 AM

## 2012-08-06 IMAGING — CT CT ANGIO CHEST
2 of 7 series · 18 of 46 positions shown · IV contrast (APPLIED)
Comparison: Plain film of 08/06/2011

CLINICAL DATA: Short of breath on exertion.  Evaluate for
pulmonary embolism.  No chest pain.

CT ANGIOGRAPHY OF THE CHEST
TECHNIQUE: Multidetector CT angiography of the chest was performed
after contrast with bolus timed to evaluate the pulmonary arteries.
Multiplanar CT image reconstructions including MIPs were obtained
to evaluate the vascular anatomy.
Contrast:  100  ml [id number]

[Series 8: pulm embolism 1.0 b25f thin · axial · 0.72mm/px · z∈[-296,-18]mm · 15 of 314 slices shown]
[im 18/314  lung]
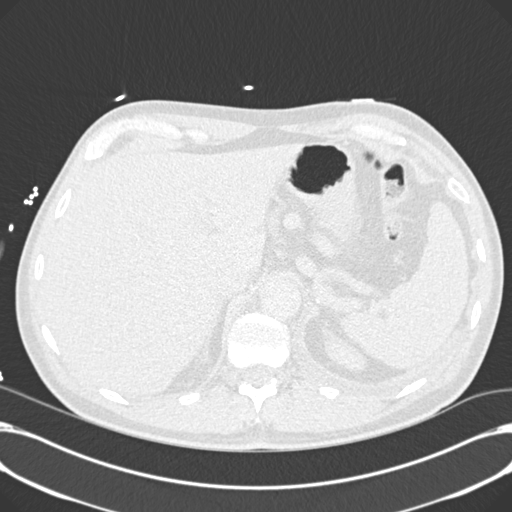
[im 35/314  soft-tissue]
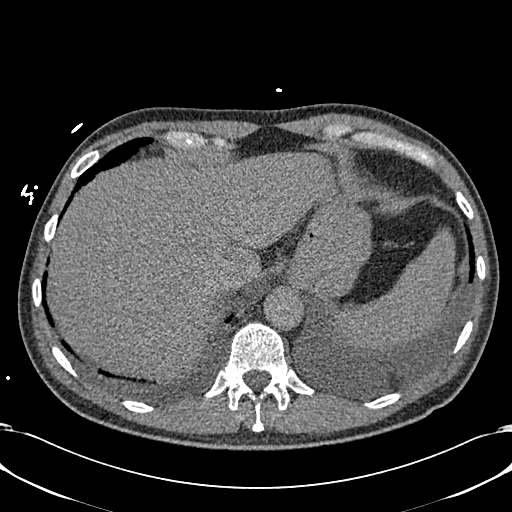
[im 53/314  lung]
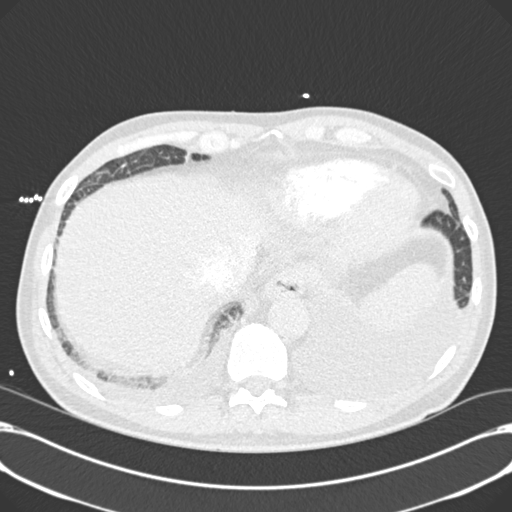
[im 70/314  soft-tissue]
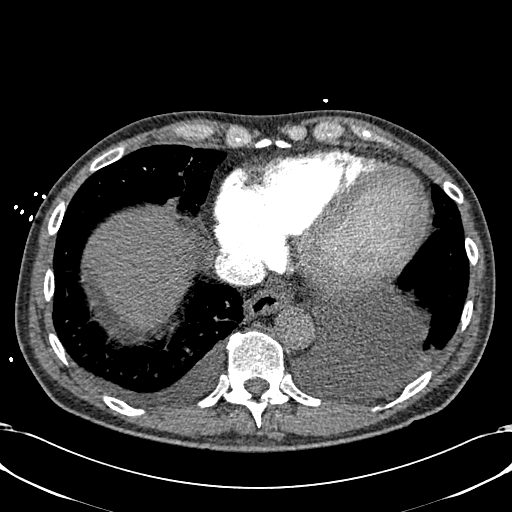
[im 105/314  lung]
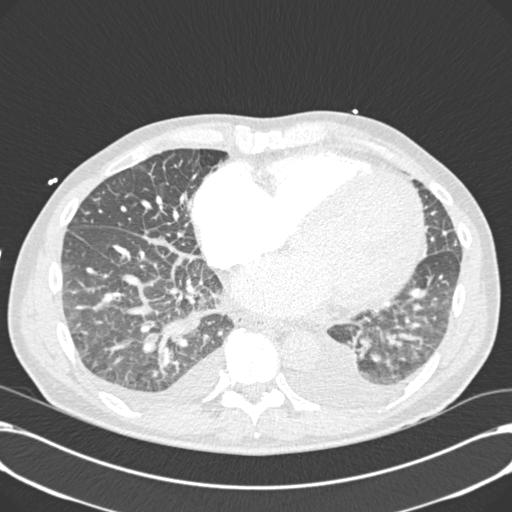
[im 122/314  soft-tissue]
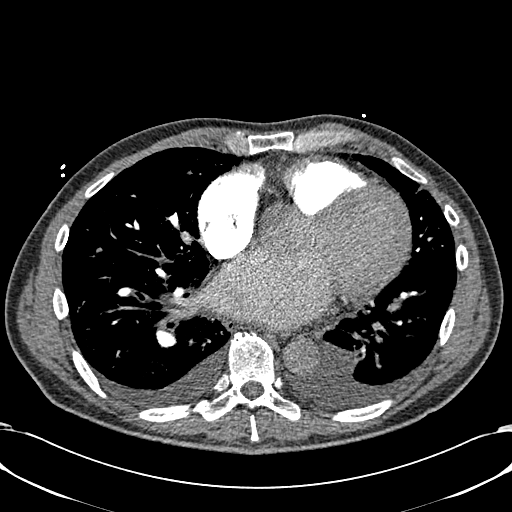
[im 140/314  lung]
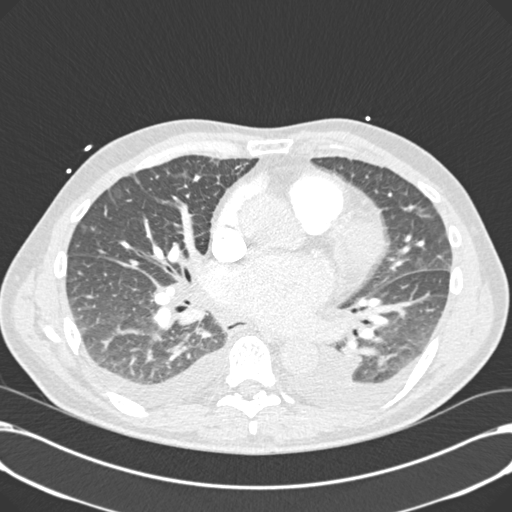
[im 157/314  soft-tissue]
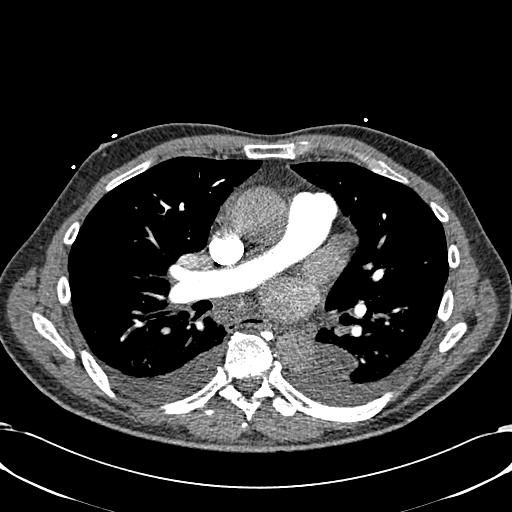
[im 174/314  lung]
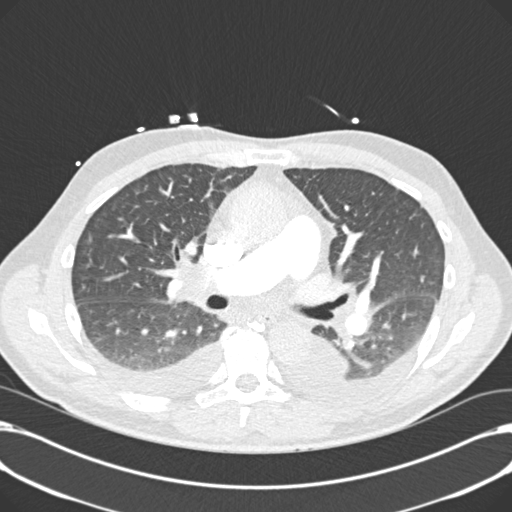
[im 192/314  soft-tissue]
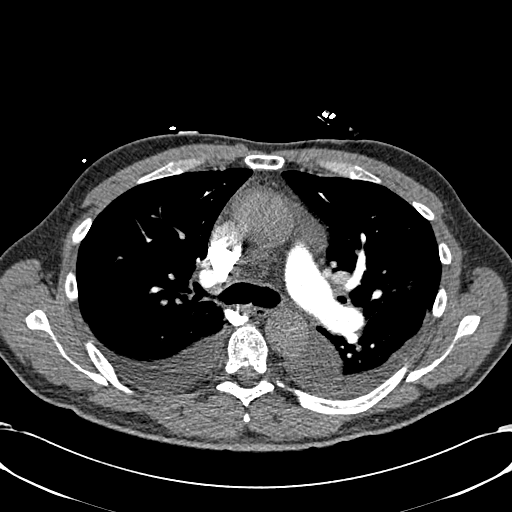
[im 209/314  lung]
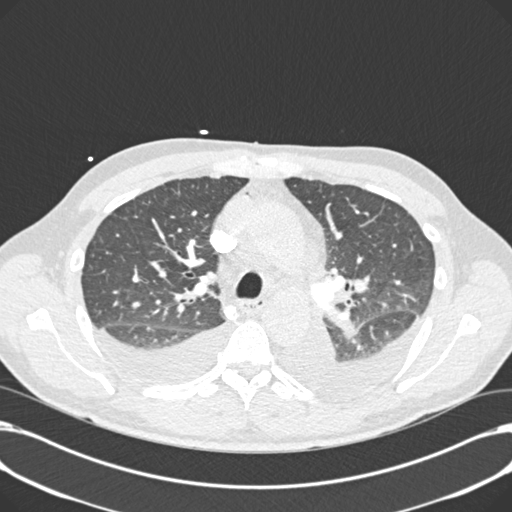
[im 244/314  soft-tissue]
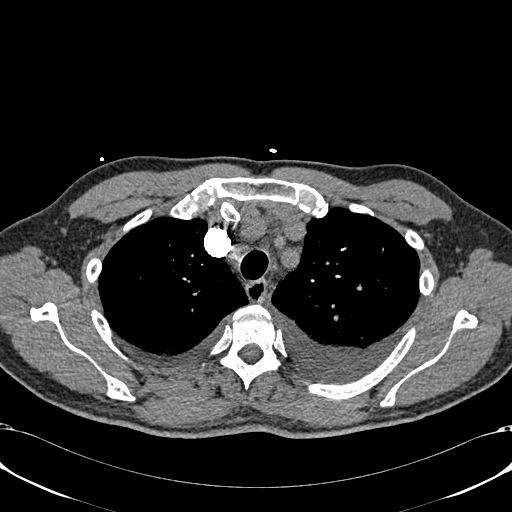
[im 261/314  lung]
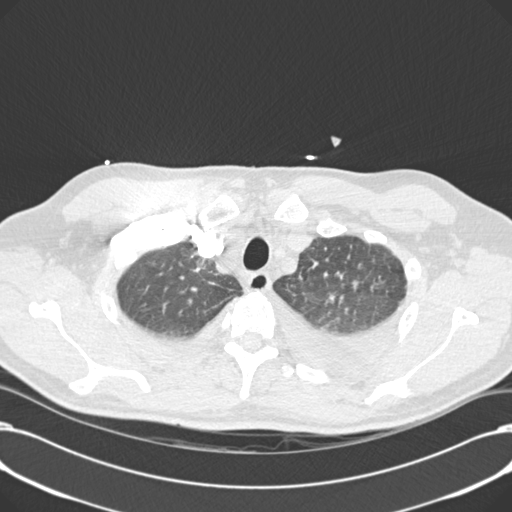
[im 279/314  soft-tissue]
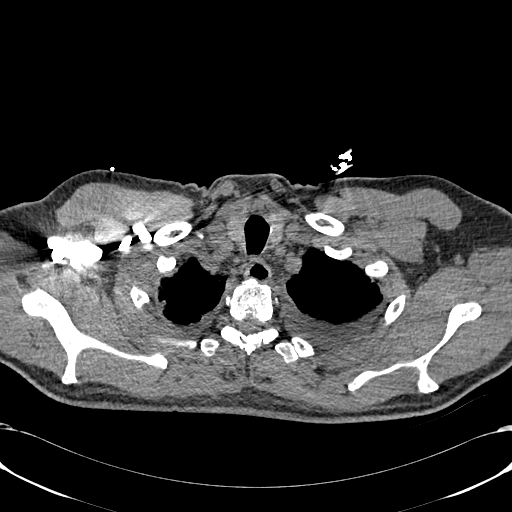
[im 296/314  lung]
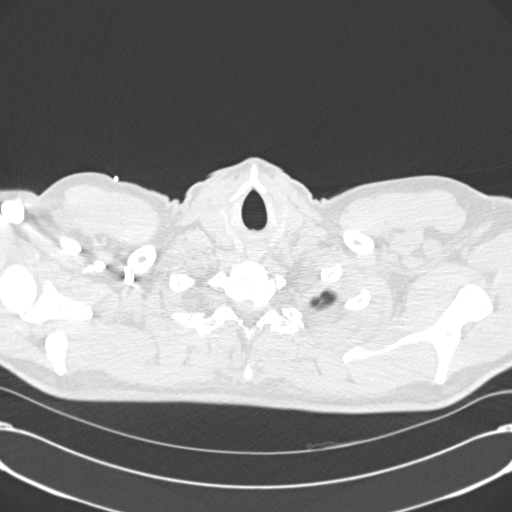

[Series 603: coronals · coronal · 0.72mm/px · 3 of 112 slices shown]
[im 28/112  soft-tissue]
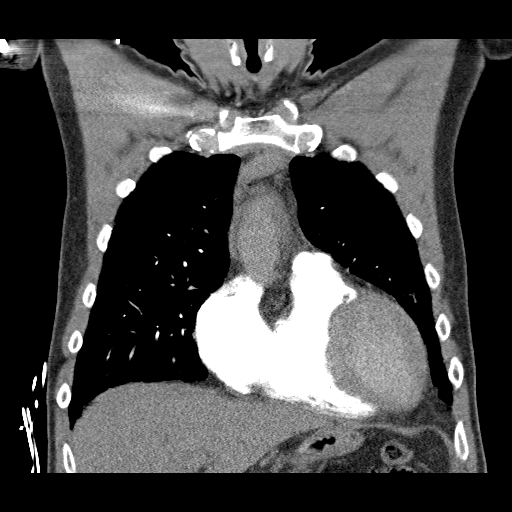
[im 56/112  soft-tissue]
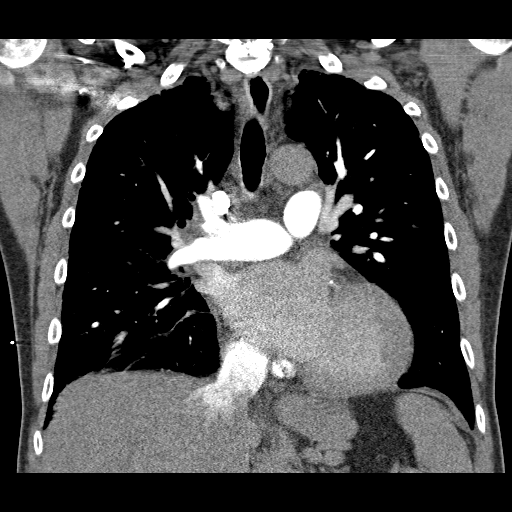
[im 84/112  soft-tissue]
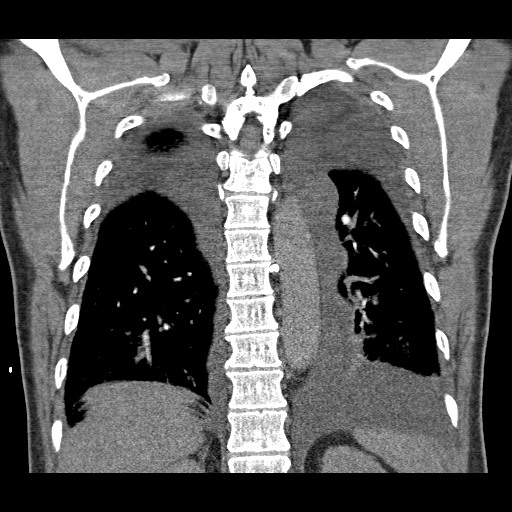

[18 of 46 positions shown; findings below may reference images not displayed]

FINDINGS: Lung windows demonstrate mild motion degradation
inferiorly.  Dependent septal thickening.
A right lower lobe subpleural nodule measures 5 mm on image 60 of
series 5.
Mild ground-glass opacification within the dependent lower lobes.

Soft tissue windows:  The quality of this exam for evaluation of
pulmonary embolism is good.  The only limitation is the above
described motion artifact.  The bolus is well timed.  No evidence
of pulmonary embolism to the segmental level.

Aorta normal in caliber.  Bolus timing limits evaluation for
dissection.  Mild cardiomegaly.  No pericardial effusion.

Age advanced coronary artery atherosclerosis is identified.
Calcified plaque identified at the distal left main on image 57 of
series 7.  There is also probable calcified plaque in the mid right
coronary artery on image 74 of series 7.
Borderline pulmonary artery enlargement.

There is extensive mediastinal edema.  Right hilar nodal tissue
measures 2.2 cm.  A right paratracheal node measures 1.0 cm.

Limited abdominal imaging demonstrates mild reflux of contrast into
the IVC.  This suggests elevated right heart pressures.

 Review of the MIP images confirms the above findings.
IMPRESSION: 1.  Mildly motion degraded exam.  Given this factor, no evidence of
pulmonary embolism.
2.  Findings of congestive heart failure.
3.  Age advanced coronary artery atherosclerosis, including within
the distal left main.
4.  Small bilateral pleural effusions.
5.  Mild thoracic adenopathy.  This can be seen with congestive
heart failure.  Consider follow-up chest CT of 3 months to confirm
resolution.

6.  Borderline pulmonary artery enlargement.  Question pulmonary
arterial hypertension.
7.  Right lower lobe lung nodule. If the patient is at high risk
for bronchogenic carcinoma, follow-up chest CT at 6-12 months is
recommended.  If the patient is at low risk for bronchogenic
carcinoma, follow-up chest CT at 12 months is recommended.  This
recommendation follows the consensus statement: "Guidelines for
Management of Small Pulmonary Nodules Detected on CT Scans: A
Statement from the [HOSPITAL]" as published in Radiology
0001; [DATE]. Online at:
[URL]

## 2012-08-09 IMAGING — CR DG CHEST 1V PORT
1 series · 1 of 1 positions shown · non-contrast
Comparison: CT chest and chest radiograph 08/06/2011.

CLINICAL DATA: Shortness of breath.

PORTABLE CHEST - 1 VIEW

[AP]
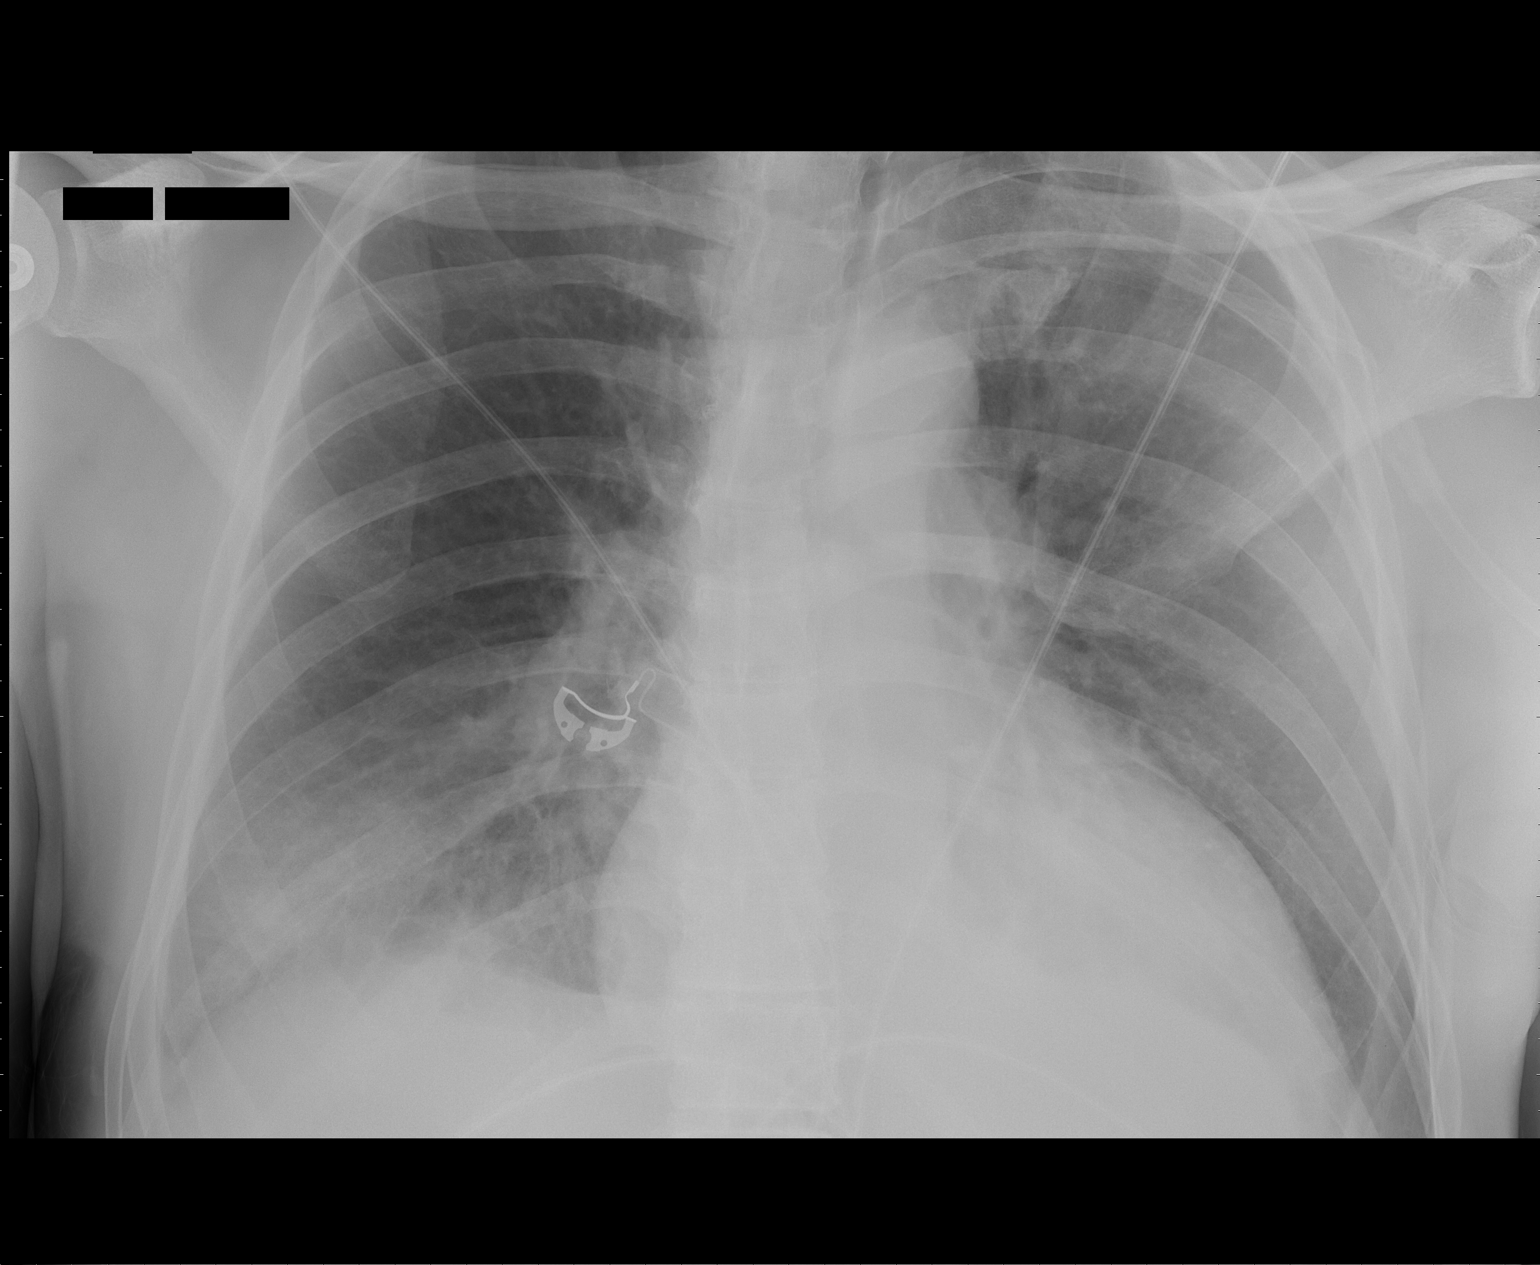

[1 of 1 positions shown; findings below may reference images not displayed]

FINDINGS: Trachea is midline.  Heart is mildly enlarged.  Bibasilar
air space disease, right greater than left.  Small bilateral
pleural effusions.
IMPRESSION: Mild congestive heart failure.

## 2013-11-07 ENCOUNTER — Other Ambulatory Visit: Payer: Self-pay | Admitting: Cardiology

## 2013-11-08 ENCOUNTER — Telehealth: Payer: Self-pay | Admitting: *Deleted

## 2013-11-08 ENCOUNTER — Other Ambulatory Visit: Payer: Self-pay | Admitting: Cardiology

## 2013-11-08 NOTE — Telephone Encounter (Signed)
Patient needs refill on Bidil to be sent to [facility].

## 2013-11-09 ENCOUNTER — Other Ambulatory Visit: Payer: Self-pay | Admitting: Cardiology

## 2013-11-09 MED ORDER — ISOSORB DINITRATE-HYDRALAZINE 20-37.5 MG PO TABS: 1.0000 | ORAL_TABLET | Freq: Two times a day (BID) | ORAL | Status: DC

## 2013-11-14 ENCOUNTER — Ambulatory Visit: Payer: PRIVATE HEALTH INSURANCE | Admitting: Cardiology

## 2014-01-29 ENCOUNTER — Other Ambulatory Visit: Payer: Self-pay | Admitting: Cardiology

## 2014-01-30 ENCOUNTER — Other Ambulatory Visit: Payer: Self-pay | Admitting: Cardiology

## 2014-01-31 ENCOUNTER — Other Ambulatory Visit: Payer: Self-pay | Admitting: Cardiology

## 2014-02-04 NOTE — Telephone Encounter (Signed)
Rx filled on 01/29/2014.

## 2014-04-17 ENCOUNTER — Telehealth: Payer: Self-pay | Admitting: Cardiology

## 2014-04-17 NOTE — Telephone Encounter (Signed)
New message    Talk to a nurse.  Want to get a note from Dr Anne Fu to excuse pt from jury duty.  May 12 is the jury date.

## 2014-04-24 ENCOUNTER — Encounter (HOSPITAL_BASED_OUTPATIENT_CLINIC_OR_DEPARTMENT_OTHER): Payer: BC Managed Care – PPO | Attending: General Surgery

## 2014-04-24 DIAGNOSIS — L97809 Non-pressure chronic ulcer of other part of unspecified lower leg with unspecified severity: Secondary | ICD-10-CM | POA: Insufficient documentation

## 2014-04-24 DIAGNOSIS — I2510 Atherosclerotic heart disease of native coronary artery without angina pectoris: Secondary | ICD-10-CM | POA: Insufficient documentation

## 2014-04-24 DIAGNOSIS — I890 Lymphedema, not elsewhere classified: Secondary | ICD-10-CM | POA: Insufficient documentation

## 2014-04-24 DIAGNOSIS — Z79899 Other long term (current) drug therapy: Secondary | ICD-10-CM | POA: Insufficient documentation

## 2014-04-24 DIAGNOSIS — I509 Heart failure, unspecified: Secondary | ICD-10-CM | POA: Insufficient documentation

## 2014-04-24 DIAGNOSIS — I10 Essential (primary) hypertension: Secondary | ICD-10-CM | POA: Insufficient documentation

## 2014-04-25 ENCOUNTER — Ambulatory Visit (HOSPITAL_COMMUNITY): Payer: BC Managed Care – PPO

## 2014-04-25 NOTE — Progress Notes (Signed)
[facility]  NAME:  RESHARD, KITTELSON NO.:  [id number]  MEDICAL RECORD NO.:  [id number]      DATE OF BIRTH:  1956-07-14  PHYSICIAN:  Ardath Sax, M.D.           VISIT DATE:                                  OFFICE VISIT   This is a 58 year old man, who unfortunately has severe bilateral lymphedema and venous ulcers on the right leg.  He has had him on the left leg, but apparently they have healed up.  He also has a history of congestive heart failure, and hypertension.  His medicines include metoprolol and Lasix.  When he came in here today he was found to have a blood pressure of 190/120, pulse of 110 temperature 98.  He weighs 175 pounds.  Today, I debrided the some of the biofilm off of these ulcers, theirs significant size probably on the anterior aspect of his leg about 10 cm x 2 cm.  He has marked edema.  We treated these wounds with Santyl and then we put him in an [employer], and he will come back in a week.  So was diagnosis is venous ulcer with inflammation of his right leg with marked lymphedema.  DIAGNOSES:  Congestive heart failure, coronary artery disease, and congestive heart failure and hypertension.     Ardath Sax, M.D.     PP/MEDQ  D:  04/24/2014  T:  04/25/2014  Job:  [id number]

## 2014-05-01 ENCOUNTER — Encounter (HOSPITAL_BASED_OUTPATIENT_CLINIC_OR_DEPARTMENT_OTHER): Payer: PRIVATE HEALTH INSURANCE | Attending: General Surgery

## 2014-05-02 NOTE — Telephone Encounter (Signed)
Spoke with patient, patient called about a note to be excused from jury duty. Questioned patient about any current issues that he felt was heart related. Advised that if so we could bet him in to see he doctor to determine if he needed to be excused from jury duty. He stayed that his heart was ok, just had some issues with current blistering on on his lower leg that was unrelated to the heart. Patient said that he would be okay for jury duty.

## 2014-05-16 ENCOUNTER — Ambulatory Visit (HOSPITAL_COMMUNITY): Payer: BC Managed Care – PPO

## 2014-05-29 ENCOUNTER — Telehealth: Payer: Self-pay | Admitting: Cardiology

## 2014-05-29 NOTE — Telephone Encounter (Signed)
New problem   Pt is having swelling in both legs and need to speak to nurse concerning this.

## 2014-05-29 NOTE — Telephone Encounter (Signed)
SPOKE  WITH  PT IN GREAT  LENGTH   RE MESSAGE  PT  WEIGHED  YESTERDAY   AT  APPROX  185   AND  OVER THE LAST  SEVERAL DAYS  HAS  GAINED  BASELINE  WEIGHT  IS APPROX    178     PT  NON COMPLIANT WITH  TAKING  LASIX   IS  SUPPOSE TO  TAKE    LASIX  40 MG  TWICE DAILY  PER PT   DOES  NOT  AMBULATE  VERY WELL AND  HAS  HARD TIME  GETTING TO  BATHROOM SO  DOES NOT TAKE  LASIX  FOR  FEAR  OF   ALL THE  TRIPS TO BATHROOM  LASIX  WAS  TAKEN YESTERDAY   IN THE  AM ONLY  DISCUSSED WITH DR   SKAINS  PT  NEEDS  TO  TO TAKE  LASIX  40 MG  BID  FOR THE  NEXT  THREE DAYS   ENCOURAGED  TO DO  SO ATTEMPTED TO GIVE AN APPT  WITH  DR Anne Fu OR  PA   AND  AFTER  SOME DISCUSSION   PT  HUNG  UP  BEFORE   WAS ABLE  TO MAKE AN APPT ENCOURAGED TO TRY  AND  TAKE FLUID  MED  AND  TO CALL  TOMORROW  EVENING WITH  UPDATE PT  AGREES./CY

## 2014-05-31 ENCOUNTER — Encounter (HOSPITAL_COMMUNITY): Payer: Self-pay | Admitting: Emergency Medicine

## 2014-05-31 ENCOUNTER — Emergency Department (HOSPITAL_COMMUNITY): Payer: BC Managed Care – PPO

## 2014-05-31 ENCOUNTER — Inpatient Hospital Stay (HOSPITAL_COMMUNITY)
Admission: EM | Admit: 2014-05-31 | Discharge: 2014-06-02 | DRG: 308 | Discharge status: Against Medical Advice | Payer: BC Managed Care – PPO | Attending: Internal Medicine | Admitting: Internal Medicine

## 2014-05-31 DIAGNOSIS — I10 Essential (primary) hypertension: Secondary | ICD-10-CM | POA: Diagnosis present

## 2014-05-31 DIAGNOSIS — I723 Aneurysm of iliac artery: Secondary | ICD-10-CM | POA: Diagnosis present

## 2014-05-31 DIAGNOSIS — R188 Other ascites: Secondary | ICD-10-CM | POA: Diagnosis present

## 2014-05-31 DIAGNOSIS — Z9114 Patient's other noncompliance with medication regimen: Secondary | ICD-10-CM

## 2014-05-31 DIAGNOSIS — Z91148 Patient's other noncompliance with medication regimen for other reason: Secondary | ICD-10-CM

## 2014-05-31 DIAGNOSIS — Z79899 Other long term (current) drug therapy: Secondary | ICD-10-CM

## 2014-05-31 DIAGNOSIS — K761 Chronic passive congestion of liver: Secondary | ICD-10-CM | POA: Diagnosis present

## 2014-05-31 DIAGNOSIS — E872 Acidosis, unspecified: Secondary | ICD-10-CM | POA: Diagnosis present

## 2014-05-31 DIAGNOSIS — I509 Heart failure, unspecified: Secondary | ICD-10-CM | POA: Diagnosis present

## 2014-05-31 DIAGNOSIS — R0601 Orthopnea: Secondary | ICD-10-CM | POA: Diagnosis present

## 2014-05-31 DIAGNOSIS — E871 Hypo-osmolality and hyponatremia: Secondary | ICD-10-CM | POA: Diagnosis present

## 2014-05-31 DIAGNOSIS — I4892 Unspecified atrial flutter: Secondary | ICD-10-CM

## 2014-05-31 DIAGNOSIS — I745 Embolism and thrombosis of iliac artery: Secondary | ICD-10-CM | POA: Diagnosis present

## 2014-05-31 DIAGNOSIS — Z9119 Patient's noncompliance with other medical treatment and regimen: Secondary | ICD-10-CM

## 2014-05-31 DIAGNOSIS — I428 Other cardiomyopathies: Secondary | ICD-10-CM | POA: Diagnosis present

## 2014-05-31 DIAGNOSIS — I2589 Other forms of chronic ischemic heart disease: Secondary | ICD-10-CM | POA: Diagnosis present

## 2014-05-31 DIAGNOSIS — I4891 Unspecified atrial fibrillation: Principal | ICD-10-CM

## 2014-05-31 DIAGNOSIS — I5023 Acute on chronic systolic (congestive) heart failure: Secondary | ICD-10-CM

## 2014-05-31 DIAGNOSIS — Z91199 Patient's noncompliance with other medical treatment and regimen due to unspecified reason: Secondary | ICD-10-CM

## 2014-05-31 DIAGNOSIS — K769 Liver disease, unspecified: Secondary | ICD-10-CM | POA: Diagnosis present

## 2014-05-31 HISTORY — DX: Unspecified atrial fibrillation: I48.91

## 2014-05-31 HISTORY — DX: Aneurysm of iliac artery: I72.3

## 2014-05-31 HISTORY — DX: Anxiety disorder, unspecified: F41.9

## 2014-05-31 HISTORY — DX: Other ascites: R18.8

## 2014-05-31 HISTORY — DX: Gastro-esophageal reflux disease without esophagitis: K21.9

## 2014-05-31 HISTORY — DX: Liver disease, unspecified: K76.9

## 2014-05-31 HISTORY — DX: Hypo-osmolality and hyponatremia: E87.1

## 2014-05-31 HISTORY — DX: Other cardiomyopathies: I42.8

## 2014-05-31 LAB — CBC
HCT: 43.9 % (ref 39.0–52.0)
HEMATOCRIT: 45.8 % (ref 39.0–52.0)
Hemoglobin: 14.6 g/dL (ref 13.0–17.0)
Hemoglobin: 14 g/dL (ref 13.0–17.0)
MCH: 26.1 pg (ref 26.0–34.0)
MCH: 25.6 pg — AB (ref 26.0–34.0)
MCHC: 31.9 g/dL (ref 30.0–36.0)
MCHC: 31.9 g/dL (ref 30.0–36.0)
MCV: 81.8 fL (ref 78.0–100.0)
MCV: 80.4 fL (ref 78.0–100.0)
Platelets: 488 10*3/uL — ABNORMAL HIGH (ref 150–400)
Platelets: 447 10*3/uL — ABNORMAL HIGH (ref 150–400)
RBC: 5.6 MIL/uL (ref 4.22–5.81)
RBC: 5.46 MIL/uL (ref 4.22–5.81)
RDW: 18.1 % — ABNORMAL HIGH (ref 11.5–15.5)
RDW: 17.9 % — ABNORMAL HIGH (ref 11.5–15.5)
WBC: 10.9 10*3/uL — AB (ref 4.0–10.5)
WBC: 12.4 10*3/uL — ABNORMAL HIGH (ref 4.0–10.5)

## 2014-05-31 LAB — BASIC METABOLIC PANEL
BUN: 22 mg/dL (ref 6–23)
BUN: 23 mg/dL (ref 6–23)
CALCIUM: 9.3 mg/dL (ref 8.4–10.5)
CALCIUM: 8.7 mg/dL (ref 8.4–10.5)
CHLORIDE: 86 meq/L — AB (ref 96–112)
CO2: 23 mEq/L (ref 19–32)
CO2: 25 meq/L (ref 19–32)
CREATININE: 0.96 mg/dL (ref 0.50–1.35)
CREATININE: 0.89 mg/dL (ref 0.50–1.35)
Chloride: 87 mEq/L — ABNORMAL LOW (ref 96–112)
GFR calc Af Amer: >90 mL/min (ref >90)
GFR calc Af Amer: >90 mL/min (ref >90)
Glucose, Bld: 104 mg/dL — ABNORMAL HIGH (ref 70–99)
Glucose, Bld: 140 mg/dL — ABNORMAL HIGH (ref 70–99)
POTASSIUM: 5.5 meq/L — AB (ref 3.7–5.3)
Potassium: 4.6 mEq/L (ref 3.7–5.3)
SODIUM: 127 meq/L — AB (ref 137–147)
Sodium: 126 mEq/L — ABNORMAL LOW (ref 137–147)

## 2014-05-31 LAB — PROTIME-INR
INR: 1.34 (ref 0.00–1.49)
Prothrombin Time: 16.3 seconds — ABNORMAL HIGH (ref 11.6–15.2)

## 2014-05-31 LAB — URINALYSIS, ROUTINE W REFLEX MICROSCOPIC
Bilirubin Urine: NEGATIVE
GLUCOSE, UA: NEGATIVE mg/dL
Ketones, ur: NEGATIVE mg/dL
LEUKOCYTES UA: NEGATIVE
NITRITE: NEGATIVE
PH: 6 (ref 5.0–8.0)
Protein, ur: NEGATIVE mg/dL
SPECIFIC GRAVITY, URINE: 1.01 (ref 1.005–1.030)
Urobilinogen, UA: 0.2 mg/dL (ref 0.0–1.0)

## 2014-05-31 LAB — I-STAT TROPONIN, ED: TROPONIN I, POC: 0.02 ng/mL (ref 0.00–0.08)

## 2014-05-31 LAB — PRO B NATRIURETIC PEPTIDE: Pro B Natriuretic peptide (BNP): 12465 pg/mL — ABNORMAL HIGH (ref 0–125)

## 2014-05-31 LAB — URINE MICROSCOPIC-ADD ON

## 2014-05-31 MED ORDER — FUROSEMIDE 10 MG/ML IJ SOLN
40.0000 mg | Freq: Once | INTRAMUSCULAR | Status: AC
  Administered 2014-05-31: 40 mg via INTRAVENOUS
  Filled 2014-05-31: qty 4

## 2014-05-31 MED ORDER — ACETAMINOPHEN 325 MG PO TABS
650.0000 mg | ORAL_TABLET | ORAL | Status: DC | PRN
  Administered 2014-05-31 – 2014-06-02 (×2): 650 mg via ORAL
  Filled 2014-05-31 (×2): qty 2

## 2014-05-31 MED ORDER — FUROSEMIDE 10 MG/ML IJ SOLN
40.0000 mg | Freq: Three times a day (TID) | INTRAMUSCULAR | Status: DC
  Administered 2014-05-31 – 2014-06-01 (×5): 40 mg via INTRAVENOUS
  Filled 2014-05-31 (×7): qty 4

## 2014-05-31 MED ORDER — METOPROLOL TARTRATE 25 MG PO TABS
25.0000 mg | ORAL_TABLET | Freq: Four times a day (QID) | ORAL | Status: DC
  Administered 2014-05-31 (×3): 25 mg via ORAL
  Filled 2014-05-31 (×7): qty 1

## 2014-05-31 MED ORDER — SODIUM CHLORIDE 0.9 % IV SOLN
250.0000 mL | INTRAVENOUS | Status: DC | PRN
  Administered 2014-06-01: 250 mL via INTRAVENOUS

## 2014-05-31 MED ORDER — SODIUM CHLORIDE 0.9 % IJ SOLN
3.0000 mL | Freq: Two times a day (BID) | INTRAMUSCULAR | Status: DC
  Administered 2014-05-31 – 2014-06-01 (×3): 3 mL via INTRAVENOUS

## 2014-05-31 MED ORDER — SODIUM CHLORIDE 0.9 % IJ SOLN: 3.0000 mL | INTRAMUSCULAR | Status: DC | PRN

## 2014-05-31 MED ORDER — ONDANSETRON HCL 4 MG/2ML IJ SOLN: 4.0000 mg | Freq: Four times a day (QID) | INTRAMUSCULAR | Status: DC | PRN

## 2014-05-31 MED ORDER — ASPIRIN EC 325 MG PO TBEC
325.0000 mg | DELAYED_RELEASE_TABLET | Freq: Every day | ORAL | Status: DC
  Administered 2014-05-31 – 2014-06-01 (×2): 325 mg via ORAL
  Filled 2014-05-31 (×5): qty 1

## 2014-05-31 MED ORDER — DILTIAZEM LOAD VIA INFUSION
10.0000 mg | Freq: Once | INTRAVENOUS | Status: AC
  Administered 2014-05-31: 10 mg via INTRAVENOUS
  Filled 2014-05-31: qty 10

## 2014-05-31 MED ORDER — ENOXAPARIN SODIUM 40 MG/0.4ML ~~LOC~~ SOLN
40.0000 mg | SUBCUTANEOUS | Status: DC
  Administered 2014-05-31 – 2014-06-01 (×2): 40 mg via SUBCUTANEOUS
  Filled 2014-05-31 (×3): qty 0.4

## 2014-05-31 MED ORDER — DILTIAZEM HCL 100 MG IV SOLR
5.0000 mg/h | INTRAVENOUS | Status: DC
  Administered 2014-05-31: 5 mg/h via INTRAVENOUS
  Administered 2014-05-31 (×2): 10 mg/h via INTRAVENOUS
  Administered 2014-06-01: 5 mg/h via INTRAVENOUS
  Filled 2014-05-31 (×2): qty 100

## 2014-05-31 NOTE — Care Management Note (Addendum)
  Page 1 of 1   06/03/2014     8:54:32 AM CARE MANAGEMENT NOTE 06/03/2014  Patient:  Corey [name]   Account Number:  [id number]  Date Initiated:  05/31/2014  Documentation initiated by:  Corey [name]  Subjective/Objective Assessment:   Admitted with [name]-fib     Action/Plan:   Cm to follow for dispostion needs   Anticipated DC Date:  06/03/2014   Anticipated DC Plan:  HOME/SELF CARE         Choice offered to / List presented to:             Status of service:  Completed, signed off Medicare Important Message given?   (If response is "NO", the following Medicare IM given date fields will be blank) Date Medicare IM given:   Date Additional Medicare IM given:    Discharge Disposition:  HOME/SELF CARE  Per UR Regulation:  Reviewed for med. necessity/level of care/duration of stay  If discussed at Long Length of Stay Meetings, dates discussed:    Comments:  Corey Birden RN, BSN, MSHL, CCM 06/03/2014  [phone] 3 east Hx/o Nurse note indicating that on [place] 06/02/2014  Pt refused vital signs pt refused medication, pt refused to listen to physician re condition. Pt stated he was ready to go home and took self off of monitor,dressed and signed AMA papers. Dispositon:  Home / Self care.   Corey Disbro RN, BSN, [place], [place] 05/31/2014  [phone] 3 east Admitted with [name]-fib diltiazem (CARDIZEM) infusion  10 mg/hr (05/31/14 0854) Social:  from home Hx/o non-compliance. Disposition Plan:  Pending

## 2014-05-31 NOTE — ED Provider Notes (Signed)
CSN: [id number]     Arrival date & time 05/31/14  9563 History   First MD Initiated Contact with Patient 05/31/14 [phone]     Chief Complaint  Patient presents with  . Leg Swelling     (Consider location/radiation/quality/duration/timing/severity/associated sxs/prior Treatment) HPI Comments: Patient is a 58 year old male with history of congestive heart failure and atrial fibrillation. This was diagnosed 3 years ago and sounds as though he has been noncompliant with his medications and cardiology followup. He presents today with complaints of increased swelling of the bilateral lower extremities that has worsened over the past week. He is feeling short of breath and is having difficulty ambulating due to this. He denies any fevers or chills. He denies any chest pain. He does admit to a sore on the front of his right lower leg for which she has been seen by wound care. Had some sort of cream, I suspect Silvadene over the wound.  His cardiologist is been Dr. Anne Fu.  The history is provided by the patient.    Past Medical History  Diagnosis Date  . Hypertension   . CHF (congestive heart failure)   . Atrial flutter    Past Surgical History  Procedure Laterality Date  . Cardioversion    . Hernia repair     No family history on file. History  Substance Use Topics  . Smoking status: Never Smoker   . Smokeless tobacco: Not on file  . Alcohol Use: No    Review of Systems  Constitutional: Positive for fatigue. Negative for fever.  Respiratory: Positive for shortness of breath.   Cardiovascular: Positive for leg swelling. Negative for chest pain.  All other systems reviewed and are negative.     Allergies  Review of patient's allergies indicates no known allergies.  Home Medications   Prior to Admission medications   Medication Sig Start Date End Date Taking? Authorizing Provider  amiodarone (PACERONE) 200 MG tablet Take 1 tablet (200 mg total) by mouth daily. 01/31/12 01/30/13   Donato Schultz, MD  BIDIL 20-37.5 MG per tablet TAKE ONE TABLET BY MOUTH TWICE DAILY 11/07/13   Donato Schultz, MD  furosemide (LASIX) 40 MG tablet Take 1 tablet (40 mg total) by mouth 2 (two) times daily. 01/31/12 01/30/13  Donato Schultz, MD  furosemide (LASIX) 40 MG tablet TAKE ONE TABLET BY MOUTH DAILY 01/29/14   Donato Schultz, MD  furosemide (LASIX) 40 MG tablet TAKE ONE TABLET BY MOUTH DAILY 01/31/14   Donato Schultz, MD  isosorbide-hydrALAZINE (BIDIL) 20-37.5 MG per tablet Take 1 tablet by mouth 2 (two) times daily. 11/09/13   Donato Schultz, MD  lisinopril (PRINIVIL,ZESTRIL) 20 MG tablet Take 1 tablet (20 mg total) by mouth daily. 01/31/12 01/30/13  Donato Schultz, MD  metoprolol succinate (TOPROL-XL) 25 MG 24 hr tablet Take 1 tablet (25 mg total) by mouth daily. 01/31/12 01/30/13  Donato Schultz, MD  potassium chloride SA (K-DUR,KLOR-CON) 20 MEQ tablet Take 1 tablet (20 mEq total) by mouth daily. 01/31/12 01/30/13  Donato Schultz, MD  warfarin (COUMADIN) 5 MG tablet Take 2.5 mg by mouth daily.    Historical Provider, MD   BP 126/104  Temp(Src) 98.1 F (36.7 C) (Oral)  Resp 13  SpO2 99% Physical Exam  Nursing note and vitals reviewed. Constitutional: He is oriented to person, place, and time.  Patient is a 58 year old male who appears older than stated age. He is in no acute distress.  HENT:  Head: Normocephalic and atraumatic.  Neck: Normal range of motion.  Neck supple.  Cardiovascular:  Heart is irregularly irregular and rapid. There are no murmurs.  Pulmonary/Chest: Effort normal and breath sounds normal. No respiratory distress. He has no wheezes.  Abdominal: Soft. Bowel sounds are normal. He exhibits no distension. There is no tenderness.  Musculoskeletal:  There is 4+ bilateral lower extremity edema. Both legs are weeping fluid. The right anterior mid shin is noted to have a sore measuring 4 cm x 8 cm approximately. There is a white cream present in the wound, I suspect Silvadene.  Neurological: He is alert and  oriented to person, place, and time. No cranial nerve deficit. He exhibits normal muscle tone. Coordination normal.  Skin: Skin is warm and dry.    ED Course  Procedures (including critical care time) Labs Review Labs Reviewed  CBC  BASIC METABOLIC PANEL  PRO B NATRIURETIC PEPTIDE  PROTIME-INR  URINALYSIS, ROUTINE W REFLEX MICROSCOPIC  I-STAT TROPOININ, ED    Imaging Review No results found.   EKG Interpretation   Date/Time:  Friday May 31 2014 06:55:01 EDT Ventricular Rate:  141 PR Interval:    QRS Duration: 106 QT Interval:  319 QTC Calculation: 489 R Axis:   124 Text Interpretation:  Atrial fibrillation Anterolateral infarct, old  Confirmed by DELOS  MD, Terre Hanneman ([id number]) on 05/31/2014 7:05:01 AM      MDM   Final diagnoses:  None    Workup reveals an elevated BNP, pulmonary edema on chest x-ray, and A. fib with RVR on his EKG. The Foley catheter was placed due to his stated inability to void and IV Lasix was administered. There was no significant residual. He was also started on a Cardizem drip for rate control. I have consult with Dr. Diona FantiKilroy from cardiology who will evaluate the patient in the ER.  CRITICAL CARE Performed by: Geoffery Lyonsouglas Djon Tith Total critical care time: 30 minutes Critical care time was exclusive of separately billable procedures and treating other patients. Critical care was necessary to treat or prevent imminent or life-threatening deterioration. Critical care was time spent personally by me on the following activities: development of treatment plan with patient and/or surrogate as well as nursing, discussions with consultants, evaluation of patient's response to treatment, examination of patient, obtaining history from patient or surrogate, ordering and performing treatments and interventions, ordering and review of laboratory studies, ordering and review of radiographic studies, pulse oximetry and re-evaluation of patient's condition.     Geoffery Lyonsouglas  Violanda Bobeck, MD 06/03/14 [phone]

## 2014-05-31 NOTE — ED Notes (Addendum)
Pt presents with bilateral 3+ pitting edema to lower extremities, bottom of feet appear purple that he noticed a couple of days ago. Pt reports SOB and hx of heart failure. A&O4.

## 2014-05-31 NOTE — H&P (Signed)
Primary Physician: Primary Cardiologist:  Anne FuSkains  [facility]   HPI: Patient is a 58 yo with a history of cardiomyopathy presumed HTN/tachycardia induced) and atrial flutter (cardioversion in past), noncompliance.. Not seen often  Last admit was 2013  Says he was seen in clinic by Michele RockersM Sk[facility] in fall  Patinet say he was feeling pretty good.  Says that he takes lasix daily.  Does not take other medicines regularly  Did take an amiodarone lately.   About a month ago got poison oak.  Went to wound care  Has had some mild LE edema.  He says that after this things got worse  Over past  1 wk LE edema became severe  He got SOB  NO CP  Unable to lie flat.   Called into clinic on 6/3 with complaints of swelling ing legs.  Called again yesterday with reprot of wt gain x 8 lbs.  Admitted to noncompliant with bid lasix (40 mg) Afraid of tripping on way to [place].           Past Medical History  Diagnosis Date  . Hypertension   . CHF (congestive heart failure)   . Atrial flutter      (Not in a hospital admission)  Meds at home :  Lasix daily 40 mg.    Does not take other meds.    Infusions: in ER . diltiazem (CARDIZEM) infusion 10 mg/hr (05/31/14 0854)    No Known Allergies  History   Social History  . Marital Status: Single    Spouse Name: N/A    Number of Children: N/A  . Years of Education: N/A   Occupational History  . Not on file.   Social History Main Topics  . Smoking status: Never Smoker   . Smokeless tobacco: Not on file  . Alcohol Use: No  . Drug Use: No  . Sexual Activity:    Other Topics Concern  . Not on file   Social History Narrative  . No narrative on file    No family history on file.  REVIEW OF SYSTEMS:  NO fevers, chills, cough.  No N/V or diarrheaAll systems reviewed  Negative to the above problem except as noted above.    PHYSICAL EXAM: Filed Vitals:   05/31/14 0900  BP: 125/88  Pulse:   Temp:   Resp: 18      Intake/Output Summary (Last 24 hours) at 05/31/14 0927 Last data filed at 05/31/14 0848  Gross per 24 hour  Intake      0 ml  Output    350 ml  Net   -350 ml    General:  Well appearing. No respiratory difficulty HEENT: normal Neck: supple.JVP at 10 Carotids 2+ bilat; no bruits. No lymphadenopathy or thryomegaly appreciated. Cor: irreg rate and rhythm.  S1, S2  + S3  No murmurs  PIMI duffuse   Lungs: rales at R base.   Abdomen:Distended  No hepatomegaly  No massess  Nontender.   Extremities: 4+ pitting edema with chronic skin changes (rubor)  Wound on R calf with silvidene cream on it.  Feet cold  1+ PT.  GU  Scrotal edema.    Neuro: alert & oriented x 3, cranial nerves grossly intact. moves all 4 extremities w/o difficulty. Affect pleasant.  ECG:  Atrial fibrillation  141 bpm.  Lateral MI.    Results for orders placed during the hospital encounter of 05/31/14 (from the past 24  hour(s))  CBC     Status: Abnormal   Collection Time    05/31/14  7:10 AM      Result Value Ref Range   WBC 10.9 (*) 4.0 - 10.5 K/uL   RBC 5.60  4.22 - 5.81 MIL/uL   Hemoglobin 14.6  13.0 - 17.0 g/dL   HCT 35.3  61.4 - 43.1 %   MCV 81.8  78.0 - 100.0 fL   MCH 26.1  26.0 - 34.0 pg   MCHC 31.9  30.0 - 36.0 g/dL   RDW 54.0 (*) 08.6 - 76.1 %   Platelets 488 (*) 150 - 400 K/uL  BASIC METABOLIC PANEL     Status: Abnormal   Collection Time    05/31/14  7:10 AM      Result Value Ref Range   Sodium 126 (*) 137 - 147 mEq/L   Potassium 5.5 (*) 3.7 - 5.3 mEq/L   Chloride 86 (*) 96 - 112 mEq/L   CO2 23  19 - 32 mEq/L   Glucose, Bld 104 (*) 70 - 99 mg/dL   BUN 22  6 - 23 mg/dL   Creatinine, Ser 9.50  0.50 - 1.35 mg/dL   Calcium 9.3  8.4 - 93.2 mg/dL   GFR calc non Af Amer >90  >90 mL/min   GFR calc Af Amer >90  >90 mL/min  PRO B NATRIURETIC PEPTIDE     Status: Abnormal   Collection Time    05/31/14  7:10 AM      Result Value Ref Range   Pro B Natriuretic peptide (BNP) 12465.0 (*) 0 - 125 pg/mL   PROTIME-INR     Status: Abnormal   Collection Time    05/31/14  7:10 AM      Result Value Ref Range   Prothrombin Time 16.3 (*) 11.6 - 15.2 seconds   INR 1.34  0.00 - 1.49  I-STAT TROPOININ, ED     Status: None   Collection Time    05/31/14  7:32 AM      Result Value Ref Range   Troponin i, poc 0.02  0.00 - 0.08 ng/mL   Comment 3            Dg Chest Port 1 View  05/31/2014   CLINICAL DATA:  LEG SWELLING LEG SWELLING, shortness of Breath  EXAM: PORTABLE CHEST - 1 VIEW  COMPARISON:  01/28/2012  FINDINGS: Moderate right pleural effusion with consolidation/ atelectasis at the right lung base, slightly increased. Mild perihilar interstitial edema or infiltrates with central pulmonary vascular congestion. Moderate cardiomegaly emphasized by portable technique. .  Visualized skeletal structures are unremarkable.  IMPRESSION: 1. Cardiomegaly with some increase in perihilar edema and right pleural effusion.   Electronically Signed   By: Oley Balm M.D.   On: 05/31/2014 08:11     ASSESSMENT: Patient is a 58 yo with presumed nonischemic cardiomyopathy.  PResents with CHF exacerbation and in rapid afib  Patient is noncompliant    Recommendations:   IV lasix to diurese  Follow Na closely.   Will start lopressor and taper diltiazem Once HR improves consider ACEI ( if K normalizes) or hydralazine / ntg Echo once HR and volume status improve  2.  Atrial fib/flutter.  Patient has been noncompliant  Had been on amiodarone and coumadin at one point. For now would add b blocker.  Review old notes.  I would hold anticoagulation until confirm if pt agrees to f/u and take regularly.  Not a candidate  for NOAC  3. Hx HTN  Follow    4.  Wound:  Will have wound nurse see patinet.

## 2014-05-31 NOTE — ED Notes (Signed)
Patient moved to [facility], placed on cardiac monitoring, blood pressure monitoring, pulse ox monitoring.  Family at bedside.  Meal tray ordered.

## 2014-05-31 NOTE — ED Notes (Signed)
Patient on cardiac monitor and had a 5 beat run of v-tach. Printed rhythm strip and showed EDP.  Place strip in patient's chart.

## 2014-05-31 NOTE — ED Notes (Signed)
Secretary ordering a 2g sodium diet for patient.

## 2014-06-01 ENCOUNTER — Inpatient Hospital Stay (HOSPITAL_COMMUNITY): Payer: BC Managed Care – PPO

## 2014-06-01 DIAGNOSIS — I723 Aneurysm of iliac artery: Secondary | ICD-10-CM

## 2014-06-01 DIAGNOSIS — I059 Rheumatic mitral valve disease, unspecified: Secondary | ICD-10-CM

## 2014-06-01 DIAGNOSIS — R188 Other ascites: Secondary | ICD-10-CM

## 2014-06-01 LAB — URINALYSIS, ROUTINE W REFLEX MICROSCOPIC
BILIRUBIN URINE: NEGATIVE
Glucose, UA: NEGATIVE mg/dL
Ketones, ur: NEGATIVE mg/dL
NITRITE: NEGATIVE
PH: 5 (ref 5.0–8.0)
Protein, ur: 30 mg/dL — AB
SPECIFIC GRAVITY, URINE: 1.017 (ref 1.005–1.030)
UROBILINOGEN UA: 1 mg/dL (ref 0.0–1.0)

## 2014-06-01 LAB — COMPREHENSIVE METABOLIC PANEL
ALT: 93 U/L — ABNORMAL HIGH (ref 0–53)
AST: 267 U/L — ABNORMAL HIGH (ref 0–37)
Albumin: 2.8 g/dL — ABNORMAL LOW (ref 3.5–5.2)
Alkaline Phosphatase: 273 U/L — ABNORMAL HIGH (ref 39–117)
BUN: 32 mg/dL — AB (ref 6–23)
CALCIUM: 8.7 mg/dL (ref 8.4–10.5)
CO2: 22 mEq/L (ref 19–32)
Chloride: 86 mEq/L — ABNORMAL LOW (ref 96–112)
Creatinine, Ser: 1.1 mg/dL (ref 0.50–1.35)
GFR calc Af Amer: 84 mL/min — ABNORMAL LOW (ref >90)
GFR calc non Af Amer: 73 mL/min — ABNORMAL LOW (ref >90)
Glucose, Bld: 133 mg/dL — ABNORMAL HIGH (ref 70–99)
Potassium: 5.7 mEq/L — ABNORMAL HIGH (ref 3.7–5.3)
Sodium: 125 mEq/L — ABNORMAL LOW (ref 137–147)
TOTAL PROTEIN: 6 g/dL (ref 6.0–8.3)
Total Bilirubin: 1.9 mg/dL — ABNORMAL HIGH (ref 0.3–1.2)

## 2014-06-01 LAB — CBC WITH DIFFERENTIAL/PLATELET
Basophils Absolute: 0 10*3/uL (ref 0.0–0.1)
Basophils Relative: 0 % (ref 0–1)
EOS PCT: 0 % (ref 0–5)
Eosinophils Absolute: 0 10*3/uL (ref 0.0–0.7)
HCT: 43.4 % (ref 39.0–52.0)
HEMOGLOBIN: 13.8 g/dL (ref 13.0–17.0)
Lymphocytes Relative: 6 % — ABNORMAL LOW (ref 12–46)
Lymphs Abs: 0.8 10*3/uL (ref 0.7–4.0)
MCH: 25.8 pg — ABNORMAL LOW (ref 26.0–34.0)
MCHC: 31.8 g/dL (ref 30.0–36.0)
MCV: 81.1 fL (ref 78.0–100.0)
MONOS PCT: 6 % (ref 3–12)
Monocytes Absolute: 0.8 10*3/uL (ref 0.1–1.0)
NEUTROS ABS: 12.8 10*3/uL — AB (ref 1.7–7.7)
Neutrophils Relative %: 88 % — ABNORMAL HIGH (ref 43–77)
Platelets: 505 10*3/uL — ABNORMAL HIGH (ref 150–400)
RBC: 5.35 MIL/uL (ref 4.22–5.81)
RDW: 17.8 % — ABNORMAL HIGH (ref 11.5–15.5)
WBC: 14.5 10*3/uL — ABNORMAL HIGH (ref 4.0–10.5)

## 2014-06-01 LAB — LACTIC ACID, PLASMA
LACTIC ACID, VENOUS: 5 mmol/L — AB (ref 0.5–2.2)
LACTIC ACID, VENOUS: 3.5 mmol/L — AB (ref 0.5–2.2)

## 2014-06-01 LAB — BASIC METABOLIC PANEL
BUN: 31 mg/dL — ABNORMAL HIGH (ref 6–23)
CALCIUM: 8.5 mg/dL (ref 8.4–10.5)
CO2: 23 meq/L (ref 19–32)
CREATININE: 1.08 mg/dL (ref 0.50–1.35)
Chloride: 87 mEq/L — ABNORMAL LOW (ref 96–112)
GFR calc Af Amer: 86 mL/min — ABNORMAL LOW (ref >90)
GFR, EST NON AFRICAN AMERICAN: 74 mL/min — AB (ref >90)
GLUCOSE: 119 mg/dL — AB (ref 70–99)
Potassium: 4.8 mEq/L (ref 3.7–5.3)
SODIUM: 125 meq/L — AB (ref 137–147)

## 2014-06-01 LAB — URINE MICROSCOPIC-ADD ON

## 2014-06-01 LAB — RAPID URINE DRUG SCREEN, HOSP PERFORMED
Amphetamines: NOT DETECTED
BARBITURATES: NOT DETECTED
Benzodiazepines: NOT DETECTED
COCAINE: NOT DETECTED
OPIATES: NOT DETECTED
Tetrahydrocannabinol: NOT DETECTED

## 2014-06-01 LAB — LIPASE, BLOOD: Lipase: 20 U/L (ref 11–59)

## 2014-06-01 LAB — MRSA PCR SCREENING: MRSA by PCR: NEGATIVE

## 2014-06-01 MED ORDER — PIPERACILLIN-TAZOBACTAM 3.375 G IVPB
3.3750 g | Freq: Three times a day (TID) | INTRAVENOUS | Status: DC
  Administered 2014-06-01 – 2014-06-02 (×4): 3.375 g via INTRAVENOUS
  Filled 2014-06-01 (×7): qty 50

## 2014-06-01 MED ORDER — MORPHINE SULFATE 2 MG/ML IJ SOLN
2.0000 mg | INTRAMUSCULAR | Status: DC | PRN
  Administered 2014-06-01 – 2014-06-02 (×2): 2 mg via INTRAVENOUS
  Filled 2014-06-01 (×2): qty 1

## 2014-06-01 MED ORDER — FUROSEMIDE 10 MG/ML IJ SOLN
40.0000 mg | Freq: Once | INTRAMUSCULAR | Status: AC
  Administered 2014-06-01: 40 mg via INTRAVENOUS

## 2014-06-01 MED ORDER — IOHEXOL 350 MG/ML SOLN: 100.0000 mL | Freq: Once | INTRAVENOUS | Status: AC | PRN

## 2014-06-01 MED ORDER — PIPERACILLIN-TAZOBACTAM 3.375 G IVPB 30 MIN
3.3750 g | Freq: Once | INTRAVENOUS | Status: AC
  Administered 2014-06-01: 3.375 g via INTRAVENOUS
  Filled 2014-06-01: qty 50

## 2014-06-01 MED ORDER — MORPHINE SULFATE 2 MG/ML IJ SOLN
2.0000 mg | Freq: Once | INTRAMUSCULAR | Status: AC
  Administered 2014-06-01: 2 mg via INTRAVENOUS
  Filled 2014-06-01: qty 1

## 2014-06-01 NOTE — Progress Notes (Signed)
ANTIBIOTIC CONSULT NOTE - INITIAL  Pharmacy Consult for zosyn  Indication: intra abdominal infection   No Known Allergies  Patient Measurements: Height: 5\' 11"  (180.3 cm) Weight: 187 lb 6.3 oz (85 kg) IBW/kg (Calculated) : 75.3 Adjusted Body Weight:   Vital Signs: Temp: 97.4 F (36.3 C) (06/06 0010) Temp src: Axillary (06/06 0010) BP: 101/81 mmHg (06/06 0010) Pulse Rate: 65 (06/06 0010) Intake/Output from previous day: 06/05 0701 - 06/06 0700 In: 240 [P.O.:240] Out: 1850 [Urine:1850] Intake/Output from this shift: Total I/O In: -  Out: 100 [Urine:100]  Labs:  Recent Labs  05/31/14 0710 05/31/14 1504 06/01/14 0123  WBC 10.9* 12.4* 14.5*  HGB 14.6 14.0 13.8  PLT 488* 447* 505*  CREATININE 0.96 0.89 1.10   Estimated Creatinine Clearance: 78.9 ml/min (by C-G formula based on Cr of 1.1). No results found for this basename: VANCOTROUGH, VANCOPEAK, VANCORANDOM, GENTTROUGH, GENTPEAK, GENTRANDOM, TOBRATROUGH, TOBRAPEAK, TOBRARND, AMIKACINPEAK, AMIKACINTROU, AMIKACIN,  in the last 72 hours   Microbiology: No results found for this or any previous visit (from the past 720 hour(s)).  Medical History: Past Medical History  Diagnosis Date  . Hypertension   . CHF (congestive heart failure)   . Atrial flutter   . Anxiety   . GERD (gastroesophageal reflux disease)     Medications:   Assessment: Zosyn for intra abdominal infection    Plan:  Zosyn 3.375gm iv q8h   Janice Coffin 06/01/2014,2:11 AM

## 2014-06-01 NOTE — Progress Notes (Addendum)
On Call Cardiology Note  Subjective: I was contacted by the patient's nurse regarding episodes of atrial fibrillation with slow ventricular response as well as severe abdominal pain.  The patient's diltiazem has recently been decreased from 10 mg/hr to 5 mg/hr with heart rates maintaining in the low 60s.  The patient states that he developed severe lower abdominal pain approximately 2 hours ago.  He does not know of a specific precipitant but is concerned that it could be due to his foley catheter.  He describes it as a diffuse, sharp and crampy sensation below his navel.  He has never experienced similar pain in the past.  He denies any relief with acetaminophen.  He was able to pass gas earlier in the day.  He reports preceding diarrhea, though his last bowel movement was about a day ago.  Objective: Temp:  [97.4 F (36.3 C)-98.7 F (37.1 C)] 97.4 F (36.3 C) (06/06 0010) Pulse Rate:  [25-154] 65 (06/06 0010) Resp:  [13-23] 22 (06/06 0010) BP: (101-136)/(64-104) 101/81 mmHg (06/06 0010) SpO2:  [95 %-100 %] 99 % (06/06 0010) Weight:  [85 kg (187 lb 6.3 oz)] 85 kg (187 lb 6.3 oz) (06/05 1441)  General: Elderly man appearing older than his stated age standing in the room.  He appears anxious.  His mother is at the bedside. Neck: Supple with JVP of approximately 12-15 cm.  Positive HJR. Rest heart: Normal work of breathing with fair air movement.  Diminished breath sounds at the bases, right greater than left. Cardiovascular: Distant heart sounds.  Irregularly irregular without murmurs. Abdomen: Bowel sounds present.  Abdomen is distended but soft.  There is periumbilical and especially suprapubic tenderness to deep palpation.  No rebound or guarding. Extremities: 4+ leg edema present.  Telemetry: Atrial fibrillation with heart rates in the 60s at this time, though rates have fallen as low as the 40s earlier in the evening.  Assessment/plan: 58 year old man with cardiomyopathy and systolic  heart failure as well as atrial fibrillation and has been complicated by medication noncompliance, whom I am seeing do to bradycardia with diltiazem infusion and new abdominal pain.  Atrial fibrillation with recurrent: Patient appears much better rate controlled on diltiazem infusion, which is currently at 5 mg/hr.  We will continue to monitor on telemetry and consider discontinuation if he remains consistently bradycardic.  Repeat echocardiogram pending.  Abdominal pain: Patient reports severe pain but does not appear to be in distress.  His abdominal exam is also benign with the exception of subjective lower abdominal tenderness and some distention that was also noted on admission.  Considerations would include bowel congestion secondary to elevated venous pressures from heart failure, obstruction/ileus, urinary tract infection (though UA was relatively bland on admission), bladder spasms from Foley catheter, pancreatitis (though location is less suggestive of this) or bowel ischemia.  Given the patient's long-standing history of atrial fibrillation and cardiomyopathy, cardioembolic events including to the mesenteric arteries, is a possibility.  Diffuse [place] low would also be a consideration given his heart failure, though he otherwise appears to be relatively well perfused.  - Obtain CMP, lipase, lactate, and repeat urinalysis.  - Perform upright and supine abdominal radiograph.  If bowel abnormality is identified on x-ray oral lactate is elevated, will proceed with CTA to evaluate for mesenteric ischemia.  - Give morphine 2 mg IV x1.  Addendum (06/01/14 @ 0421): The aforementioned studies were performed and were notable for an elevated lactate of 5, creatinine of 1.1, and K 5.7.  Due  to ongoing abdominal and pelvic pain, CTA of the abdomen and pelvis was obtained.  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  Occlusion of the left internal iliac artery also evident.  Anasarca with ascites, right pleural effusion, and evidence of venous congestion/hepatopathy was also noted.  Vascular surgery has been consulted for further recommendations and management of the right common iliac aneurysm.  There is no clear bowel pathology corresponding to elevated lactate, suggesting that it is due to mesenteric venous congestion and/or low flow from severe decompensated heart failure.  We will stop diltiazem and metoprolol at this time and continue with aggressive diuresis.  Should the patient's atrial fibrillation return to rapid ventricular response, would favor digoxin for rate control in the short-term.  Urinalysis also notable for possible UTI; CBC also demonstrates rising white blood cell count with a left shift.  Blood and urine cultures ordered, as well as Zosyn.  Due to the complexity of the patient's condition, he has been transferred to step-down for closer monitoring.  Yvonne Kendall, MD Cardiology Fellow

## 2014-06-01 NOTE — Progress Notes (Signed)
Patient continues to have c/o abdominal pain unrelieved by ordered medication. MD had ordered labs, which resulted abnormal.  MD placed orders to transfer patient to SDU for closer monitoring, ordered IV Abx and STAT CT Angio.  Antibiotic administered as ordered, cardizem gtt d/c'd as ordered and report called to RN on 2H19.  Patient is currently in radiology for CT Angio.  Corey [name]

## 2014-06-01 NOTE — Progress Notes (Signed)
Patient having c/o severe lower abdominal pain unrelieved by PRN Tylenol. MD on call notified, MD to round and assess patient. Will continue to monitor. Troy Sine

## 2014-06-01 NOTE — Progress Notes (Signed)
Upon reviewing telemetry after receiving report, it was noted that patient's heart rate had been dropping into the 40's-although the patient has remained asymptomatic.  Cardizem gtt lowered to 5mg /hr and MD on call notified.  Per MD, continue to monitor heart rate with gtt on 5mg /hr.    Troy Sine

## 2014-06-01 NOTE — Progress Notes (Signed)
Echo Lab  2D Echocardiogram completed.  Cortnee Steinmiller L Darcell Sabino, RDCS 06/01/2014 10:32 AM

## 2014-06-01 NOTE — Consult Note (Signed)
[facility] [place]  Patient name: Corey [name] MRN: [id number] DOB: [birth year] Sex: male  REASON FOR CONSULT: right common iliac artery aneurysm. The consult is from Dr. Okey DupreEnd  HPI: Corey [name] is a 58 y.o. male who was admitted yesterday with CHF exacerbation and rapid atrial fibrillation. He reportedly has a history of a nonischemic cardiomyopathy and has been somewhat noncompliant. He developed some abdominal pain and this prompted a CT scan.  The CT scan showed a right common iliac artery aneurysm and vascular surgery was consult for further recommendations.  The patient developed some lower abdominal pain early this morning. He states that since that time he has been able to void and that the abdominal pain essentially resolved. He denies any acute onset abdominal pain or back pain.  He does tell me that he had poison ivy on his right leg and developed a blister which has been treated at the wound care center.  Past Medical History  Diagnosis Date  . Hypertension   . CHF (congestive heart failure)   . Atrial flutter   . Anxiety   . GERD (gastroesophageal reflux disease)    History reviewed. No pertinent family history.  FAMILY HISTORY: He states that his father had his first heart attack at age 57.  SOCIAL HISTORY: History  Substance Use Topics  . Smoking status: Never Smoker   . Smokeless tobacco: Never Used  . Alcohol Use: Yes     Comment: 05/31/2014 "no alcohol for years; never had problems w/alcohol"   He is single. He does not have any children. He denies alcohol use.  No Known Allergies  REVIEW OF SYSTEMS: Arly.Keller[X ] denotes positive finding; [  ] denotes negative finding CARDIOVASCULAR:  [ ]  chest pain   [ ]  chest pressure   [ ]  palpitations   Arly.Keller[X ] orthopnea   Arly.Keller[X ] dyspnea on exertion   [ ]  claudication   [ ]  rest pain   [ ]  DVT   [ ]  phlebitis PULMONARY:   [ ]  productive cough   [ ]  asthma   [ ]  wheezing NEUROLOGIC:   [ ]  weakness  [ ]   paresthesias  [ ]  aphasia  [ ]  amaurosis  [ ]  dizziness HEMATOLOGIC:   [ ]  bleeding problems   [ ]  clotting disorders MUSCULOSKELETAL:  [ ]  joint pain   [ ]  joint swelling [ ]  leg swelling GASTROINTESTINAL: [ ]   blood in stool  [ ]   hematemesis GENITOURINARY:  [ ]   dysuria  [ ]   hematuria PSYCHIATRIC:  [ ]  history of major depression INTEGUMENTARY:  [ ]  rashes  Arly.Keller[X ] ulcers right leg CONSTITUTIONAL:  [ ]  fever   [ ]  chills  PHYSICAL EXAM: Filed Vitals:   06/01/14 0400 06/01/14 0415 06/01/14 0430 06/01/14 0500  BP: 101/81 106/83 114/81 104/81  Pulse: 85 79 99 89  Temp:      TempSrc:      Resp: 29 23 18 18   Height: 5\' 11"  (1.803 m)     Weight: 189 lb 2.5 oz (85.8 kg)     SpO2: 97% 100% 97% 92%   Body mass index is 26.39 kg/(m^2). GENERAL: The patient is a well-nourished male, in no acute distress. The vital signs are documented above. CARDIOVASCULAR: There is a regular rate and rhythm. I do not detect carotid bruits. He has palpable femoral pulses. He has significant bilateral lower extremity swelling. Given the swelling, I am unable to palpate popliteal,  dorsalis pedis, or posterior tibial pulses. PULMONARY: There is good air exchange bilaterally without wheezing or rales. ABDOMEN: Soft and non-tender with normal pitched bowel sounds. He has moderate swelling likely related to his ascites. MUSCULOSKELETAL: There are no major deformities or cyanosis. NEUROLOGIC: No focal weakness or paresthesias are detected. SKIN: he has a large superficial ulcer on his right leg anteriorly. In addition, he has hyperpigmentation bilaterally consistent with chronic venous insufficiency. PSYCHIATRIC: The patient has a normal affect.  DATA:  Lab Results  Component Value Date   WBC 14.5* 06/01/2014   HGB 13.8 06/01/2014   HCT 43.4 06/01/2014   MCV 81.1 06/01/2014   PLT 505* 06/01/2014   Lab Results  Component Value Date   NA 125* 06/01/2014   K 5.7* 06/01/2014   CL 86* 06/01/2014   CO2 22 06/01/2014   Lab  Results  Component Value Date   CREATININE 1.10 06/01/2014   Lab Results  Component Value Date   INR 1.34 05/31/2014   INR 1.81* 01/31/2012   INR 2.27* 01/30/2012   Lab Results  Component Value Date   HGBA1C 6.2* 08/08/2011   CT ABD/PELVIS:  This shows evidence of right heart failure and or tricuspid regurgitation as contrast extends down into the inferior vena cava into dilated hepatic veins. The patient has hepatic congestion. The patient is also noted to have ascites. Radiology noted a 2.5 cm common iliac artery aneurysm with mural thrombus and "active hemorrhage into the thrombus.". He also suggested a short segment dissection.  By my interpretation, he has a 26 mm right common iliac artery aneurysm and a 15 cm left common artery. There is no evidence of leak or retroperitoneal hematoma associated with the right common iliac artery aneurysm. There is laminated thrombus bilaterally. There is no abdominal aortic aneurysm. The superior mesenteric artery, celiac axis, and in inferior mesenteric artery are widely patent.  Chest x-ray: This shows cardiomegaly and pulmonary vascular congestion with pleural effusions. These findings are consistent with CHF.  MEDICAL ISSUES:  RIGHT COMMON ILIAC ARTERY ANEURYSM: his CT scan shows a 2.6 mm right common iliac artery aneurysm and some slight ectasia of his left common iliac artery. There is no abdominal aortic aneurysm. There is no evidence of rupture of the right common iliac artery aneurysm. I have recommended a follow up CT scan in 6 months and I will arrange that and see him back as an outpatient at that time. Would only consider elective repair of the aneurysm if this enlarge significantly or became symptomatic. Unfortunately there is not enough normal artery above and below the aneurysm to address this with simply placing a stent. He would likely require open repair. Given his ascites and significant congestive heart failure I think he would be at  increased risk for open repair.  RIGHT LEG ULCER: This patient developed an ulcer related to poison ivy he had on the right leg but also clearly has evidence of chronic venous insufficiency with significant bilateral lower extremity swelling. This wound is followed in the wound care center and he should continue his follow up there. I will obtain ABIs while he is in the hospital. I am unable to palpate pulses given the significant swelling in both lower extremities. The feet appear adequately perfused but it would be nice to know that he has adequate circulation to heal this wound on his right leg.  The patient has multiple other medical conditions being treated by the medical service including hyponatremia, hyperkalemia, atrial fibrillation, and significant  congestive heart failure.  Chuck Hint [facility] [place] [facility]: [phone]

## 2014-06-01 NOTE — Progress Notes (Signed)
Patient Name: Corey [name] Date of Encounter: 06/01/2014  Active Problems:   Acute on chronic systolic congestive heart failure   Acute on chronic congestive heart failure   Length of Stay: 1  SUBJECTIVE  No longer orthopneic , abdominal pain resolved VR 90  CURRENT MEDS . aspirin EC  325 mg Oral Daily  . enoxaparin (LOVENOX) injection  40 mg Subcutaneous Q24H  . furosemide  40 mg Intravenous TID  . piperacillin-tazobactam (ZOSYN)  IV  3.375 g Intravenous 3 times per day  . sodium chloride  3 mL Intravenous Q12H    OBJECTIVE   Intake/Output Summary (Last 24 hours) at 06/01/14 0851 Last data filed at 06/01/14 0515  Gross per 24 hour  Intake  252.5 ml  Output   2625 ml  Net -2372.5 ml   Filed Weights   05/31/14 1441 06/01/14 0400  Weight: 187 lb 6.3 oz (85 kg) 189 lb 2.5 oz (85.8 kg)    PHYSICAL EXAM Filed Vitals:   06/01/14 0415 06/01/14 0430 06/01/14 0500 06/01/14 0750  BP: 106/83 114/81 104/81   Pulse: 79 99 89   Temp:    97.8 F (36.6 C)  TempSrc:    Axillary  Resp: 23 18 18 13   Height:      Weight:      SpO2: 100% 97% 92% 92%   General: Alert, oriented x3, no distress Head: no evidence of trauma, PERRL, EOMI, no exophtalmos or lid lag, no myxedema, no xanthelasma; normal ears, nose and oropharynx Neck: 8-10 jugular venous pulsations and no hepatojugular reflux; brisk carotid pulses without delay and no carotid bruits Chest: clear to auscultation, no signs of consolidation by percussion or palpation, normal fremitus, symmetrical and full respiratory excursions Cardiovascular: inferolaterally displaced apical impulse, regular rhythm, normal first and second heart sounds, no rubs or gallops, 1/6 holosystolic murmur Abdomen: no tenderness or distention, no masses by palpation, no abnormal pulsatility or arterial bruits, normal bowel sounds, no hepatosplenomegaly Extremities: 4+ pitting edema with chronic skin changes (brawny) Wound on R calf with silvidene  cream on it. Feet cold 1+ PT.  GU Scrotal edema.   Neurological: grossly nonfocal  LABS  CBC  Recent Labs  05/31/14 1504 06/01/14 0123  WBC 12.4* 14.5*  NEUTROABS  --  12.8*  HGB 14.0 13.8  HCT 43.9 43.4  MCV 80.4 81.1  PLT 447* 505*   Basic Metabolic Panel  Recent Labs  06/01/14 0123 06/01/14 0746  NA 125* 125*  K 5.7* 4.8  CL 86* 87*  CO2 22 23  GLUCOSE 133* 119*  BUN 32* 31*  CREATININE 1.10 1.08  CALCIUM 8.7 8.5   Liver Function Tests  Recent Labs  06/01/14 0123  AST 267*  ALT 93*  ALKPHOS 273*  BILITOT 1.9*  PROT 6.0  ALBUMIN 2.8*    Recent Labs  06/01/14 0123  LIPASE 20   Cardiac Enzymes No results found for this basename: CKTOTAL, CKMB, CKMBINDEX, TROPONINI,  in the last 72 hours  Radiology Studies Imaging results have been reviewed and Dg Chest Port 1 View  05/31/2014   CLINICAL DATA:  LEG SWELLING LEG SWELLING, shortness of Breath  EXAM: PORTABLE CHEST - 1 VIEW  COMPARISON:  01/28/2012  FINDINGS: Moderate right pleural effusion with consolidation/ atelectasis at the right lung base, slightly increased. Mild perihilar interstitial edema or infiltrates with central pulmonary vascular congestion. Moderate cardiomegaly emphasized by portable technique. .  Visualized skeletal structures are unremarkable.  IMPRESSION: 1. Cardiomegaly with some increase in perihilar  edema and right pleural effusion.   Electronically Signed   By: Oley Balmaniel  Hassell M.D.   On: 05/31/2014 08:11   Dg Abd Portable 2v  06/01/2014   CLINICAL DATA:  Abdominal pain.  EXAM: PORTABLE ABDOMEN - 2 VIEW  COMPARISON:  None.  FINDINGS: There is a moderate amount of stool and colon, mainly in the sigmoid region. No distended small bowel loops to suggest obstruction. No free air. The bony structures are unremarkable. Vascular calcifications are noted.  IMPRESSION: No plain film findings for an acute abdominal process.   Electronically Signed   By: Loralie ChampagneMark  Gallerani M.D.   On: 06/01/2014 01:01    Ct Angio Abd/pel W/ And/or W/o  06/01/2014   CLINICAL DATA:  Abdominal pain and swelling.  EXAM: CTA ABDOMEN AND PELVIS wITHOUT AND WITH CONTRAST  TECHNIQUE: Multidetector CT imaging of the abdomen and pelvis was performed using the standard protocol during bolus administration of intravenous contrast. Multiplanar reconstructed images and MIPs were obtained and reviewed to evaluate the vascular anatomy.  CONTRAST:  100 cc Omnipaque 350  COMPARISON:  None.  FINDINGS: The lung bases demonstrate a moderate to large right pleural effusion with overlying atelectasis and a small to moderate-sized left pleural effusion. The heart is enlarged. No pericardial effusion.  Moderate abdominal/pelvic ascites and diffuse mesenteric edema. The liver is mildly enlarged. No obvious changes of cirrhosis. Suspect chronic hepatic congestion with marked dilatation of the hepatic veins which are fill with contrast suggesting right heart failure and/or tricuspid regurgitation. No focal hepatic lesions or biliary dilatation. The gallbladder wall is thickened but this is likely due to the surrounding fluid. No common bile duct dilatation. The pancreas is moderately atrophied but no mass or inflammation. The spleen is normal in size. No focal lesions. The adrenal glands and kidneys are grossly normal.  The stomach, duodenum, small bowel and colon are grossly normal without oral contrast. No inflammatory changes, mass lesions or obstructive findings. No findings for pneumatosis or free air.  The aorta is normal in caliber. The major branch vessels are patent. No dissection. The right common iliac artery demonstrates a 2.5 cm aneurysm. There is extensive mural thrombus with some extravasation into the thrombus. More inferiorly there appears to be a short segment dissection or saccular aneurysm. The right internal and external iliac arteries are patent. The left internal iliac artery is occluded in reconstituted distally.  The bladder is  unremarkable. A small dot of air is likely from catheterization. The prostate gland is enlarged. Moderate free pelvic fluid. Moderate diverticulosis of the sigmoid colon. No inguinal mass or adenopathy.  The bony structures are unremarkable.  Review of the MIP images confirms the above findings.  IMPRESSION: 1. Bilateral pleural effusions and bibasilar atelectasis, right greater than left. 2. Cardiac enlargement. 3. Right heart failure and/or tricuspid regurgitation with contrast material extending down the IVC and into dilated hepatic veins. 4. Hepatic congestion syndrome. 5. Ascites, mesenteric and body wall edema likely due to hepatic disease. 6. 2.5 cm common iliac artery aneurysm the right side with extensive mural thrombus and active hemorrhage into the thrombus. There is also a short segment dissection. 7. The left internal iliac artery is occluded. 8. Prostate gland enlargement.   Electronically Signed   By: Loralie ChampagneMark  Gallerani M.D.   On: 06/01/2014 04:07    TELE AF controlled ventricular rate, occasional PVCs.  ECG AF, poor anterior R wave progression  ASSESSMENT AND PLAN  Acute on chronic combined systolic and diastolic heart failure - biventricular  decompensation; marked improvement in orthopnea and some reduction in edema, still grossly hypervolemic (expect at least another 8L of diuresis needed) . Will defer starting ACE i for another day in view of electrolyte abnormalities. Not ready for beta blocker. DIlated cardiomyopathy, etiology uncertain - he has extensive evidence of PAD and coronary calcification on old chest CTA. Presume ischemic cardiomyopathy until proven otherwise. Will retrieve old workup. Echo pending. 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 Lactic  acidosis - exam and CT findings do not support mesenteric infarction. Rechecked lactate level has improved. Suspect due to poor cardiac output. Hyponatremia - fluid restrict 2000 mL/24h; expression of CHF severity.  Thurmon Fair, MD, [facility] [phone] office [phone] pager 06/01/2014 8:51 AM

## 2014-06-02 ENCOUNTER — Encounter (HOSPITAL_COMMUNITY): Payer: Self-pay | Admitting: Cardiology

## 2014-06-02 DIAGNOSIS — I5023 Acute on chronic systolic (congestive) heart failure: Secondary | ICD-10-CM

## 2014-06-02 DIAGNOSIS — K769 Liver disease, unspecified: Secondary | ICD-10-CM

## 2014-06-02 DIAGNOSIS — I723 Aneurysm of iliac artery: Secondary | ICD-10-CM

## 2014-06-02 DIAGNOSIS — I428 Other cardiomyopathies: Secondary | ICD-10-CM | POA: Diagnosis present

## 2014-06-02 DIAGNOSIS — R188 Other ascites: Secondary | ICD-10-CM | POA: Diagnosis present

## 2014-06-02 DIAGNOSIS — E871 Hypo-osmolality and hyponatremia: Secondary | ICD-10-CM

## 2014-06-02 DIAGNOSIS — I4891 Unspecified atrial fibrillation: Secondary | ICD-10-CM

## 2014-06-02 HISTORY — DX: Other cardiomyopathies: I42.8

## 2014-06-02 HISTORY — DX: Other ascites: R18.8

## 2014-06-02 HISTORY — DX: Liver disease, unspecified: K76.9

## 2014-06-02 HISTORY — DX: Unspecified atrial fibrillation: I48.91

## 2014-06-02 HISTORY — DX: Hypo-osmolality and hyponatremia: E87.1

## 2014-06-02 HISTORY — DX: Aneurysm of iliac artery: I72.3

## 2014-06-02 LAB — BASIC METABOLIC PANEL
BUN: 29 mg/dL — AB (ref 6–23)
CALCIUM: 8 mg/dL — AB (ref 8.4–10.5)
CO2: 27 mEq/L (ref 19–32)
CREATININE: 0.97 mg/dL (ref 0.50–1.35)
Chloride: 89 mEq/L — ABNORMAL LOW (ref 96–112)
GFR calc non Af Amer: 90 mL/min — ABNORMAL LOW (ref >90)
Glucose, Bld: 107 mg/dL — ABNORMAL HIGH (ref 70–99)
POTASSIUM: 3.5 meq/L — AB (ref 3.7–5.3)
Sodium: 129 mEq/L — ABNORMAL LOW (ref 137–147)

## 2014-06-02 LAB — BLOOD GAS, VENOUS
ACID-BASE EXCESS: 4.2 mmol/L — AB (ref 0.0–2.0)
BICARBONATE: 27.7 meq/L — AB (ref 20.0–24.0)
DRAWN BY: 339971
FIO2: 0.21 %
O2 Saturation: 73.8 %
PATIENT TEMPERATURE: 98.6
TCO2: 28.9 mmol/L (ref 0–100)
pCO2, Ven: 37.8 mmHg — ABNORMAL LOW (ref 45.0–50.0)
pH, Ven: 7.478 — ABNORMAL HIGH (ref 7.250–7.300)
pO2, Ven: 40.6 mmHg (ref 30.0–45.0)

## 2014-06-02 LAB — URINE CULTURE
Colony Count: NO GROWTH
Culture: NO GROWTH
Special Requests: NORMAL

## 2014-06-02 NOTE — Discharge Summary (Signed)
Physician Discharge Summary       Patient ID: Corey [name] MRN: [id number] DOB/AGE: [birth year] 58 y.o.  Admit date: 05/31/2014 Discharge date: 06/02/2014  Discharge Diagnoses:  Active Problems:   Acute on chronic systolic congestive heart failure   Atrial fibrillation with RVR   NICM (nonischemic cardiomyopathy), presumed EF 15% by echo, though pt pt may need further ischemic work up, pt signed out prior to this determination   Non compliance w medication regimen   Unspecified essential hypertension   Ascites   Hepatopathy, congested   Hyposmolality and/or hyponatremia, due to CHF   Iliac aneurysm   Discharged Condition: fair  Procedures: none  Hospital Course: 58 yo with a history of cardiomyopathy presumed HTN/tachycardia induced) and atrial flutter (cardioversion in past), noncompliance.. Not seen often Last admit was 2013 Says he was seen in clinic by Mic[facility] in fall  Patient stated he was feeling pretty good. Stated that he was taking lasix daily. Does not take other medicines regularly Did take an amiodarone lately.  About a month ago got poison oak. Went to wound care Has had some mild LE edema. He says that after this things got worse Over past 1 wk LE edema became severe, He got SOB NO CP Unable to lie flat. Called into clinic on 6/3 with complaints of swellinging legs.  Called again yesterday with reprot of wt gain x 8 lbs. Admitted to be noncompliant with bid lasix (40 mg) Afraid of tripping on way to [place].  He was seen by Dr. Tenny Crawoss and admitted to stepdown with NICM-presumed and a fib with RVR.    During hospitalization on IV dilt for RVR with a fib,  his HR did drop into the 40s.  He also developed abd pain presumed to be from the lasix. His dilt was stopped and Morphine added to pt for pain control.  CTA was ordered for mesenteric ischemia. The CT revealed common iliac artery aneurysm and some slight ectasia of his left common iliac artery. NO AAA. He was  seen by Dr C. Edilia BoDickson "I have recommended a follow up CT scan in 6 months and I will arrange that and see him back as an outpatient at that time. Would only consider elective repair of the aneurysm if this enlarge significantly or became symptomatic. Unfortunately there is not enough normal artery above and below the aneurysm to address this with simply placing a stent. He would likely require open repair. Given his ascites and significant congestive heart failure I think he would be at increased risk for open repair."  Atrial fib- Chronic now rate controlled. Dilated cardiomyopathy would need ischemic workup. Congestive hepatopathy and ascites - this and low cardiac output syndrome probably explain his abdominal pain, now resolved, Benign abdominal exam today.  Lactic acidosis - exam and CT findings do not support mesenteric infarction. Rechecked lactate level has improved. Suspect due to poor cardiac output.  Hyponatremia - fluid restrict 2000 mL/24h; expression of CHF severity Anticoagulation: compliance an issue- pt left AMA before this was further addressed  Pt was seen on 06/02/14 but pt wanted to go home and Dr. Royann Shiversroitoru did not believe he was stable for discharge.  Pt is at high risk for multiple health issues not excluding sudden death.  This was explained to both the pt and his mother.  Never less pt  refused to stay and signed out against medical advise. He left without medications or instructions.  Our office will contact him  tomorrow for outpt folow up vs readmit.  Wt at discharge 182 down from 189.   I&O for hospitalization -4403.    Consults: cardiology; Vascular surgery  Significant Diagnostic Studies:  BMET    Component Value Date/Time   NA 129* 06/02/2014 0252   K 3.5* 06/02/2014 0252   CL 89* 06/02/2014 0252   CO2 27 06/02/2014 0252   GLUCOSE 107* 06/02/2014 0252   BUN 29* 06/02/2014 0252   CREATININE 0.97 06/02/2014 0252   CALCIUM 8.0* 06/02/2014 0252   GFRNONAA 90* 06/02/2014 0252    GFRAA >90 06/02/2014 0252    CBC    Component Value Date/Time   WBC 14.5* 06/01/2014 0123   RBC 5.35 06/01/2014 0123   HGB 13.8 06/01/2014 0123   HCT 43.4 06/01/2014 0123   PLT 505* 06/01/2014 0123   MCV 81.1 06/01/2014 0123   MCH 25.8* 06/01/2014 0123   MCHC 31.8 06/01/2014 0123   RDW 17.8* 06/01/2014 0123   LYMPHSABS 0.8 06/01/2014 0123   MONOABS 0.8 06/01/2014 0123   EOSABS 0.0 06/01/2014 0123   BASOSABS 0.0 06/01/2014 0123      BNP (last 3 results)  Recent Labs  05/31/14 0710  PROBNP 12465.0*   Lactic acid 5;  Follow up 3.5 U/a with leukocytes and + large hgb, negative nitrites Drug screen negative Blood cultures P  2D Echo: Left ventricle: The cavity size was moderately dilated. There was mild concentric hypertrophy. The estimated ejection fraction was 15%. Diffuse hypokinesis. There is akinesis of the entirelateral myocardium. - Aortic valve: Moderate thickening and calcification, consistent with sclerosis. - Mitral valve: Calcification, with mild involvement of chords. There was moderate to severe regurgitation directed eccentrically. - Left atrium: The atrium was mildly to moderately dilated. - Right ventricle: The cavity size was moderately to severely dilated. Wall thickness was normal. Systolic function was moderately to severely reduced. - Right atrium: The atrium was severely dilated. - Tricuspid valve: There was moderate-severe regurgitation. - Pulmonary arteries: PA peak pressure: 50 mm Hg (S). - Pericardium, extracardiac: There was a right pleural effusion. There was a left pleural effusion.   CT angio ABD/PELVIS: IMPRESSION: 1. Bilateral pleural effusions and bibasilar atelectasis, right greater than left. 2. Cardiac enlargement. 3. Right heart failure and/or tricuspid regurgitation with contrast material extending down the IVC and into dilated hepatic veins. 4. Hepatic congestion syndrome. 5. Ascites, mesenteric and body wall edema likely due to  hepatic disease. 6. 2.5 cm common iliac artery aneurysm the right side with extensive mural thrombus and active hemorrhage into the thrombus. There is also a short segment dissection. 7. The left internal iliac artery is occluded. 8. Prostate gland enlargement.  ABD FILM PORTABLE ABDOMEN - 2 VIEW COMPARISON: None. FINDINGS: There is a moderate amount of stool and colon, mainly in the sigmoid region. No distended small bowel loops to suggest obstruction. No free air. The bony structures are unremarkable. Vascular calcifications are noted. IMPRESSION: No plain film findings for an acute abdominal process.  PORTABLE CHEST - 1 VIEW COMPARISON: 01/28/2012 FINDINGS: Moderate right pleural effusion with consolidation/ atelectasis at the right lung base, slightly increased. Mild perihilar interstitial edema or infiltrates with central pulmonary vascular congestion. Moderate cardiomegaly emphasized by portable technique. . Visualized skeletal structures are unremarkable. IMPRESSION: 1. Cardiomegaly with some increase in perihilar edema and right pleural effusion     Discharge Exam: Blood pressure 100/63, pulse 107, temperature 97.6 F (36.4 C), temperature source Oral, resp. rate 26, height 5\' 11"  (1.803 m), weight 182 lb (  82.555 kg), SpO2 95.00%.    Disposition: 07-Left Against Medical Advice     Medication List    ASK your doctor about these medications       amiodarone 200 MG tablet  Commonly known as:  PACERONE  Take 200 mg by mouth daily.     aspirin 81 MG tablet  Take 81 mg by mouth daily.     furosemide 40 MG tablet  Commonly known as:  LASIX  Take 40 mg by mouth daily.     potassium chloride SA 20 MEQ tablet  Commonly known as:  K-DUR,KLOR-CON  Take 20 mEq by mouth daily.     SSD 1 % cream  Generic drug:  silver sulfADIAZINE  Apply 1 application topically daily. To legs     sulfamethoxazole-trimethoprim 400-80 MG per tablet  Commonly known as:   BACTRIM,SEPTRA  Take 1 tablet by mouth 2 (two) times daily.          Discharge Instructions:Pt left AMA  Signed: Nada Boozer Nurse Practitioner-Certified [facility]: [facility] 06/02/2014, 11:34 AM  Time spent on discharge : to see and do summary with MD time with discussion of extent of pt's illness > 45 minutes.    I tried to reason with Corey [name] and offered many ways to help with his hospital stay. Leaving at this point virtually guarantees readmission or even worse outcomes. He still is grossly and severely hypervolemic and has important electrolyte abnormalities. He refused to stay.  Thurmon Fair, MD, [facility] [phone] office [phone] pager

## 2014-06-02 NOTE — Progress Notes (Signed)
Pt refused vital signs pt refused medication, pt refused to listen to physician re condition. Pt states that he is ready to go home pt took self off of monitor and dressed signed AMA papers. Education on CHF and pt condition and symptoms of CHF and that he should return if he needs medical treatment. Mother with patient educated mother as well.

## 2014-06-02 NOTE — Progress Notes (Signed)
Pt anxious states that he is ready to go home. Pt had bowel movement in floor. Pt education about CHF given patient states that he feels better and that he can take care of himself.

## 2014-06-04 ENCOUNTER — Telehealth: Payer: Self-pay | Admitting: Cardiology

## 2014-06-04 NOTE — Telephone Encounter (Signed)
New Message  Pt mother called to cancel appt for 06/05/2014. She states that the pt may not be able to come in because his legs are weeping.. Pt states that she will wait to cancel the appt until after she speaks with a nurse to confirm if the appt is needed or not. Please call. SR

## 2014-06-04 NOTE — Telephone Encounter (Signed)
Pt's mother called to cancelled the appointment for tomorrow. Pt's mother states that pt was in the ER on 05/31/14 and the ER doctor recommended to be see in the cardiology clinic. Pt's mother will call for later appointment.

## 2014-06-05 ENCOUNTER — Encounter: Payer: PRIVATE HEALTH INSURANCE | Admitting: Physician Assistant

## 2014-06-06 ENCOUNTER — Telehealth: Payer: Self-pay

## 2014-06-06 NOTE — Telephone Encounter (Signed)
Phone call from pt.  C/o pain in lower abdomen from naval down to right groin.  Stated "it come and goes and is agonizing at times."  Stated this is the same pain he had while in the hospital recently.  Is requesting pain medication. .Advised Dr. Scot Dock is not in the office at this time, and I could give him appt. To see Dr. Scot Dock next week.  Stated his pain is "horrible", and requested to have a prescription for pain medication.  Advised if he is in horrible pain, he should go to the [facility] ER.  Stated he wasn't going to go to the ER.  Discussed with Dr. Oneida Alar.  Recommended to go to the ER today, or to schedule appt. With Dr. Scot Dock next week.  Ret'd call to pt.  Offered appt. 6/17 @ 2:30 PM with Dr. Scot Dock.  Advised if pain worsens, he needs to go to the ER, and not wait to see Dr. Scot Dock on 6/17.  Verb. Understanding.

## 2014-06-07 LAB — CULTURE, BLOOD (ROUTINE X 2)
CULTURE: NO GROWTH
Culture: NO GROWTH

## 2014-06-12 ENCOUNTER — Encounter: Payer: Self-pay | Admitting: Vascular Surgery

## 2014-06-12 ENCOUNTER — Emergency Department (HOSPITAL_COMMUNITY)
Admission: EM | Admit: 2014-06-12 | Discharge: 2014-06-12 | Disposition: A | Discharge status: Home / Self Care | Payer: BC Managed Care – PPO | Attending: Emergency Medicine | Admitting: Emergency Medicine

## 2014-06-12 ENCOUNTER — Ambulatory Visit (INDEPENDENT_AMBULATORY_CARE_PROVIDER_SITE_OTHER): Payer: BC Managed Care – PPO | Admitting: Vascular Surgery

## 2014-06-12 ENCOUNTER — Encounter (HOSPITAL_COMMUNITY): Payer: Self-pay | Admitting: Emergency Medicine

## 2014-06-12 VITALS — BP 104/69 | HR 110 | Temp 97.4°F | Ht 71.0 in | Wt 182.0 lb

## 2014-06-12 DIAGNOSIS — R141 Gas pain: Secondary | ICD-10-CM | POA: Insufficient documentation

## 2014-06-12 DIAGNOSIS — E871 Hypo-osmolality and hyponatremia: Secondary | ICD-10-CM

## 2014-06-12 DIAGNOSIS — I723 Aneurysm of iliac artery: Secondary | ICD-10-CM | POA: Insufficient documentation

## 2014-06-12 DIAGNOSIS — R142 Eructation: Secondary | ICD-10-CM

## 2014-06-12 DIAGNOSIS — R339 Retention of urine, unspecified: Secondary | ICD-10-CM

## 2014-06-12 DIAGNOSIS — M79609 Pain in unspecified limb: Secondary | ICD-10-CM

## 2014-06-12 DIAGNOSIS — Z8659 Personal history of other mental and behavioral disorders: Secondary | ICD-10-CM | POA: Insufficient documentation

## 2014-06-12 DIAGNOSIS — Z8719 Personal history of other diseases of the digestive system: Secondary | ICD-10-CM | POA: Insufficient documentation

## 2014-06-12 DIAGNOSIS — R143 Flatulence: Secondary | ICD-10-CM

## 2014-06-12 DIAGNOSIS — I509 Heart failure, unspecified: Secondary | ICD-10-CM | POA: Insufficient documentation

## 2014-06-12 DIAGNOSIS — R34 Anuria and oliguria: Secondary | ICD-10-CM | POA: Insufficient documentation

## 2014-06-12 DIAGNOSIS — I10 Essential (primary) hypertension: Secondary | ICD-10-CM | POA: Insufficient documentation

## 2014-06-12 DIAGNOSIS — R109 Unspecified abdominal pain: Secondary | ICD-10-CM | POA: Insufficient documentation

## 2014-06-12 DIAGNOSIS — Z79899 Other long term (current) drug therapy: Secondary | ICD-10-CM | POA: Insufficient documentation

## 2014-06-12 DIAGNOSIS — I739 Peripheral vascular disease, unspecified: Secondary | ICD-10-CM

## 2014-06-12 DIAGNOSIS — Z7982 Long term (current) use of aspirin: Secondary | ICD-10-CM | POA: Insufficient documentation

## 2014-06-12 LAB — URINALYSIS, ROUTINE W REFLEX MICROSCOPIC
BILIRUBIN URINE: NEGATIVE
GLUCOSE, UA: NEGATIVE mg/dL
HGB URINE DIPSTICK: NEGATIVE
Ketones, ur: NEGATIVE mg/dL
Leukocytes, UA: NEGATIVE
Nitrite: NEGATIVE
PROTEIN: NEGATIVE mg/dL
Specific Gravity, Urine: 1.01 (ref 1.005–1.030)
UROBILINOGEN UA: 0.2 mg/dL (ref 0.0–1.0)
pH: 5.5 (ref 5.0–8.0)

## 2014-06-12 LAB — I-STAT CHEM 8, ED
BUN: 28 mg/dL — AB (ref 6–23)
CREATININE: 1.1 mg/dL (ref 0.50–1.35)
Calcium, Ion: 1.19 mmol/L (ref 1.12–1.23)
Chloride: 85 mEq/L — ABNORMAL LOW (ref 96–112)
GLUCOSE: 105 mg/dL — AB (ref 70–99)
HCT: 47 % (ref 39.0–52.0)
HEMOGLOBIN: 16 g/dL (ref 13.0–17.0)
Potassium: 5.3 mEq/L (ref 3.7–5.3)
SODIUM: 122 meq/L — AB (ref 137–147)
TCO2: 24 mmol/L (ref 0–100)

## 2014-06-12 MED ORDER — HYDROCODONE-ACETAMINOPHEN 5-325 MG PO TABS: 1.0000 | ORAL_TABLET | Freq: Four times a day (QID) | ORAL | Status: AC | PRN

## 2014-06-12 MED ORDER — OXYCODONE-ACETAMINOPHEN 5-325 MG PO TABS
1.0000 | ORAL_TABLET | Freq: Once | ORAL | Status: AC
  Administered 2014-06-12: 1 via ORAL
  Filled 2014-06-12: qty 1

## 2014-06-12 NOTE — Progress Notes (Signed)
History of Present Illness  Corey [name] is a [age] y.o. male who presents with chief complaint: Of pain in the lower abdomin and pain with urination.  The patient presents today for pain control help.  He was seen in the hospital by dr. Edilia Boickson on 06/01/2014 for right common iliac artery aneurysm.  He has a f/u duplex scan.     Past Medical History  Diagnosis Date  . Hypertension   . CHF (congestive heart failure)   . Atrial flutter   . Anxiety   . GERD (gastroesophageal reflux disease)   . Atrial fibrillation with RVR 06/02/2014  . NICM (nonischemic cardiomyopathy), presumed EF 15% by echo 06/02/2014  . Ascites 06/02/2014  . Hepatopathy, congested 06/02/2014  . Hyposmolality and/or hyponatremia, due to CHF 06/02/2014  . Iliac aneurysm 06/02/2014    Past Surgical History  Procedure Laterality Date  . Cardioversion  07/2011    Hattie Perch/notes 08/16/2011  . Inguinal hernia repair Bilateral 06/2007    Hattie Perch/notes 07/10/2007   History   Social History  . Marital Status: Single    Spouse Name: N/A    Number of Children: N/A  . Years of Education: N/A   Occupational History  . Not on file.   Social History Main Topics  . Smoking status: Never Smoker   . Smokeless tobacco: Never Used  . Alcohol Use: Yes     Comment: 05/31/2014 "no alcohol for years; never had problems w/alcohol"  . Drug Use: No  . Sexual Activity: Not Currently   Other Topics Concern  . Not on file   Social History Narrative  . No narrative on file    No family history on file.  Current Outpatient Prescriptions on File Prior to Visit  Medication Sig Dispense Refill  . aspirin 81 MG tablet Take 81 mg by mouth daily.      Marland Kitchen. sulfamethoxazole-trimethoprim (BACTRIM,SEPTRA) 400-80 MG per tablet Take 1 tablet by mouth 2 (two) times daily.      Marland Kitchen. amiodarone (PACERONE) 200 MG tablet Take 200 mg by mouth daily.      . furosemide (LASIX) 40 MG tablet Take 40 mg by mouth daily.      . potassium chloride SA (K-DUR,KLOR-CON) 20 MEQ tablet  Take 20 mEq by mouth daily.      Marland Kitchen. SSD 1 % cream Apply 1 application topically daily. To legs       No current facility-administered medications on file prior to visit.    No Known Allergies  Review of Systems: As listed above, otherwise negative.  Physical Examination  Filed Vitals:   06/12/14 1448  BP: 104/69  Pulse: 110  Temp: 97.4 F (36.3 C)  TempSrc: Oral  Height: 5\' 11"  (1.803 m)  Weight: 182 lb (82.555 kg)  SpO2: 100%    General: A&O x 3 Pulmonary: Sym exp, good air movt, CTAB  Cardiac: RRR,   Gastrointestinal: soft, NTND Extended abdomin/pelvis tender and firm. Palpable femoral pulses Sever lower extremity edema with right anterior shin open wound.    Medical Decision Making  Corey [name] is a 58 y.o. male who presents with: Possible distended bladder. The patient states he has difficult with urinary retention.  We called the urologist office and they were already closed.  We told the patient to go [facility] ER.  He will f/u with us in 6 months for repeat aortoiliac ultrasound.  The patient was seen by Dr. Edilia Boickson today.   [facility]  Office: [phone]   06/12/2014, 3:50 PM  Agree with above. The patient presents with significant lower abdominal pain and on exam appears to have a distended bladder area I think most likely he has significant urinary retention. We tried to get him to see a urologist today but it was too late today so he is going to the emergency department to be evaluated. He is scheduled to see me back in 6 months with a CT scan to follow his right common iliac artery aneurysm.  Waverly Ferrari, MD, FACS Beeper [phone] 06/12/2014

## 2014-06-12 NOTE — Discharge Instructions (Signed)
Follow with Urology for your catheter. Follow with your cardiologist for your low sodium.  Acute Urinary Retention Acute urinary retention is the temporary inability to urinate. This is a common problem in older men. As men age their prostates become larger and block the flow of urine from the bladder. This is usually a problem that has come on gradually.  HOME CARE INSTRUCTIONS If you are sent home with a Foley catheter and a drainage system, you will need to discuss the best course of action with your health care provider. While the catheter is in, maintain a good intake of fluids. Keep the drainage bag emptied and lower than your catheter. This is so that contaminated urine will not flow back into your bladder, which could lead to a urinary tract infection. There are two main types of drainage bags. One is a large bag that usually is used at night. It has a good capacity that will allow you to sleep through the night without having to empty it. The second type is called a leg bag. It has a smaller capacity, so it needs to be emptied more frequently. However, the main advantage is that it can be attached by a leg strap and can go underneath your clothing, allowing you the freedom to move about or leave your home. Only take over-the-counter or prescription medicines for pain, discomfort, or fever as directed by your health care provider.  SEEK MEDICAL CARE IF:  You develop a low-grade fever.  You experience spasms or leakage of urine with the spasms. SEEK IMMEDIATE MEDICAL CARE IF:   You develop chills or fever.  Your catheter stops draining urine.  Your catheter falls out.  You start to develop increased bleeding that does not respond to rest and increased fluid intake. MAKE SURE YOU:  Understand these instructions.  Will watch your condition.  Will get help right away if you are not doing well or get worse. Document Released: 03/21/2001 Document Revised: 12/18/2013 Document Reviewed:  05/24/2013 [facility] Patient Information 2015 [place], [place]. This information is not intended to replace advice given to you by your health care provider. Make sure you discuss any questions you have with your health care provider.  Hyponatremia  Hyponatremia is when the amount of salt (sodium) in your blood is too low. When sodium levels are low, your cells will absorb extra water and swell. The swelling happens throughout the body, but it mostly affects the brain. Severe brain swelling (cerebral edema), seizures, or coma can happen.  CAUSES   Heart, kidney, or liver problems.  Thyroid problems.  Adrenal gland problems.  Severe vomiting and diarrhea.  Certain medicines or illegal drugs.  Dehydration.  Drinking too much water.  Low-sodium diet. SYMPTOMS   Nausea and vomiting.  Confusion.  Lethargy.  Agitation.  Headache.  Twitching or shaking (seizures).  Unconsciousness.  Appetite loss.  Muscle weakness and cramping. DIAGNOSIS  Hyponatremia is identified by a simple blood test. Your caregiver will perform a history and physical exam to try to find the cause and type of hyponatremia. Other tests may be needed to measure the amount of sodium in your blood and urine. TREATMENT  Treatment will depend on the cause.   Fluids may be given through the vein (IV).  Medicines may be used to correct the sodium imbalance. If medicines are causing the problem, they will need to be adjusted.  Water or fluid intake may be restricted to restore proper balance. The speed of correcting the sodium problem is  very important. If the problem is corrected too fast, nerve damage (sometimes unchangeable) can happen. HOME CARE INSTRUCTIONS   Only take medicines as directed by your caregiver. Many medicines can make hyponatremia worse. Discuss all your medicines with your caregiver.  Carefully follow any recommended diet, including any fluid restrictions.  You may be asked to repeat  lab tests. Follow these directions.  Avoid alcohol and recreational drugs. SEEK MEDICAL CARE IF:   You develop worsening nausea, fatigue, headache, confusion, or weakness.  Your original hyponatremia symptoms return.  You have problems following the recommended diet. SEEK IMMEDIATE MEDICAL CARE IF:   You have a seizure.  You faint.  You have ongoing diarrhea or vomiting. MAKE SURE YOU:   Understand these instructions.  Will watch your condition.  Will get help right away if you are not doing well or get worse. Document Released: 12/03/2002 Document Revised: 03/06/2012 Document Reviewed: 05/30/2011 [facility] Patient Information 2015 [place]. This information is not intended to replace advice given to you by your health care provider. Make sure you discuss any questions you have with your health care provider.

## 2014-06-12 NOTE — ED Provider Notes (Signed)
CSN: [id number]     Arrival date & time 06/12/14  1741 History   First MD Initiated Contact with Patient 06/12/14 1751     Chief Complaint  Patient presents with  . Urinary Retention     (Consider location/radiation/quality/duration/timing/severity/associated sxs/prior Treatment) The history is provided by the patient.   patient was sent in by vascular surgery for possible urinary retention. He states he's had lower abdominal pain and swelling for the last week or 2. He states he's been having difficulty urinating, particularly at night. States she wakes him up at night and he urinates just a little bit and he feels a little better. No fevers. Is a history of CHF. He is weeping a both his lower legs, which is been notable for the last couple weeks. He states he does not have a history of prostate problems, but has seen a urologist in the past and states he was just given samples. No difficulty breathing. He states he's lost a fair amount of weight, it was after that given medication adjustments for CHF with a recent admission.  Past Medical History  Diagnosis Date  . Hypertension   . CHF (congestive heart failure)   . Atrial flutter   . Anxiety   . GERD (gastroesophageal reflux disease)   . Atrial fibrillation with RVR 06/02/2014  . NICM (nonischemic cardiomyopathy), presumed EF 15% by echo 06/02/2014  . Ascites 06/02/2014  . Hepatopathy, congested 06/02/2014  . Hyposmolality and/or hyponatremia, due to CHF 06/02/2014  . Iliac aneurysm 06/02/2014   Past Surgical History  Procedure Laterality Date  . Cardioversion  07/2011    Hattie Perch 08/16/2011  . Inguinal hernia repair Bilateral 06/2007    Hattie Perch 07/10/2007   No family history on file. History  Substance Use Topics  . Smoking status: Never Smoker   . Smokeless tobacco: Never Used  . Alcohol Use: Yes     Comment: 05/31/2014 "no alcohol for years; never had problems w/alcohol"    Review of Systems  Constitutional: Negative for fever, activity  change and appetite change.  Eyes: Negative for pain.  Respiratory: Negative for chest tightness and shortness of breath.   Cardiovascular: Negative for chest pain and leg swelling.  Gastrointestinal: Positive for abdominal pain and abdominal distention. Negative for nausea, vomiting and diarrhea.  Genitourinary: Positive for decreased urine volume. Negative for flank pain.  Musculoskeletal: Negative for back pain and neck stiffness.  Skin: Positive for wound. Negative for rash.  Neurological: Negative for weakness, numbness and headaches.  Psychiatric/Behavioral: Negative for behavioral problems.      Allergies  Review of patient's allergies indicates no known allergies.  Home Medications   Prior to Admission medications   Medication Sig Start Date End Date Taking? Authorizing Provider  aspirin 81 MG tablet Take 81 mg by mouth daily.   Yes Historical Provider, MD  furosemide (LASIX) 40 MG tablet Take 40 mg by mouth daily.   Yes Historical Provider, MD  isosorbide-hydrALAZINE (BIDIL) 20-37.5 MG per tablet Take 1 tablet by mouth daily.   Yes Historical Provider, MD  phenazopyridine (PYRIDIUM) 200 MG tablet  06/02/14  Yes Historical Provider, MD  SSD 1 % cream Apply 1 application topically daily. To legs 05/30/14  Yes Historical Provider, MD  HYDROcodone-acetaminophen (NORCO/VICODIN) 5-325 MG per tablet Take 1 tablet by mouth every 6 (six) hours as needed for moderate pain. 06/12/14   Juliet Rude. Kamal Jurgens, MD   BP 100/78  Pulse 95  Temp(Src) 98.5 F (36.9 C) (Oral)  Resp 16  SpO2 99% Physical Exam  Nursing note and vitals reviewed. Constitutional: He is oriented to person, place, and time. He appears well-developed and well-nourished.  HENT:  Head: Normocephalic and atraumatic.  Eyes: EOM are normal. Pupils are equal, round, and reactive to light.  Neck: Normal range of motion. Neck supple.  Cardiovascular: Normal rate, regular rhythm and normal heart sounds.   No murmur  heard. Pulmonary/Chest: Effort normal and breath sounds normal.  Abdominal: Soft. Bowel sounds are normal. He exhibits mass. He exhibits no distension. There is tenderness. There is no rebound and no guarding.  Lower abdominal tenderness and swelling. There is a mass up to his umbilicus from his pelvic area. No hernias palpated  Musculoskeletal: Normal range of motion. He exhibits edema.  Weeping swelling of bilateral lower extremities. There is a ulcer on his right leg.  Neurological: He is alert and oriented to person, place, and time. No cranial nerve deficit.  Skin: Skin is warm and dry.  Psychiatric: He has a normal mood and affect.    ED Course  BLADDER CATHETERIZATION Date/Time: 06/13/2014 12:10 AM Performed by: Benjiman CorePICKERING, Vi Biddinger R. Authorized by: Billee CashingPICKERING, Rendon Howell R. Consent: Verbal consent obtained. Risks and benefits: risks, benefits and alternatives were discussed Indications: prostatic obstruction Local anesthesia used: no Patient sedated: no Preparation: Patient was prepped and draped in the usual sterile fashion. Catheter type: coude Catheter size: 16 Fr Complicated insertion: no Altered anatomy: no Bladder irrigation: no Number of attempts: 1 Urine volume: 500 ml Urine characteristics: clear Patient tolerance: Patient tolerated the procedure well with no immediate complications.   (including critical care time) Labs Review Labs Reviewed  I-STAT CHEM 8, ED - Abnormal; Notable for the following:    Sodium 122 (*)    Chloride 85 (*)    BUN 28 (*)    Glucose, Bld 105 (*)    All other components within normal limits  URINALYSIS, ROUTINE W REFLEX MICROSCOPIC    Imaging Review No results found.   EKG Interpretation None      MDM   Final diagnoses:  Urinary retention  Hyponatremia    Patient with urinary retention. Foley catheter placed. He had relief of his pain. Good renal function. He does have hyponatremia. He has had some blood clot changes  recently with his CHF. Will discharge home to follow with his cardiologist and urologist.       Juliet RudeNathan R. Rubin PayorPickering, MD 06/13/14 [id number]

## 2014-06-12 NOTE — ED Notes (Signed)
Pt states he is having lower abd pain, pcp sent pt over here due to possible urinary retention. Pt has lost 25 lbs in the past 3 weeks and has weeping in lower extremities.

## 2014-06-27 ENCOUNTER — Emergency Department (HOSPITAL_COMMUNITY)
Admission: EM | Admit: 2014-06-27 | Discharge: 2014-07-27 | Disposition: E | Payer: BC Managed Care – PPO | Attending: Emergency Medicine | Admitting: Emergency Medicine

## 2014-06-27 DIAGNOSIS — Z79899 Other long term (current) drug therapy: Secondary | ICD-10-CM | POA: Insufficient documentation

## 2014-06-27 DIAGNOSIS — I4891 Unspecified atrial fibrillation: Secondary | ICD-10-CM | POA: Insufficient documentation

## 2014-06-27 DIAGNOSIS — Z8719 Personal history of other diseases of the digestive system: Secondary | ICD-10-CM | POA: Insufficient documentation

## 2014-06-27 DIAGNOSIS — Z7982 Long term (current) use of aspirin: Secondary | ICD-10-CM | POA: Insufficient documentation

## 2014-06-27 DIAGNOSIS — R609 Edema, unspecified: Secondary | ICD-10-CM | POA: Insufficient documentation

## 2014-06-27 DIAGNOSIS — I509 Heart failure, unspecified: Secondary | ICD-10-CM | POA: Insufficient documentation

## 2014-06-27 DIAGNOSIS — I469 Cardiac arrest, cause unspecified: Secondary | ICD-10-CM | POA: Insufficient documentation

## 2014-06-27 DIAGNOSIS — I10 Essential (primary) hypertension: Secondary | ICD-10-CM | POA: Insufficient documentation

## 2014-06-27 DIAGNOSIS — Z8659 Personal history of other mental and behavioral disorders: Secondary | ICD-10-CM | POA: Insufficient documentation

## 2014-06-27 MED ORDER — EPINEPHRINE HCL 0.1 MG/ML IJ SOSY
PREFILLED_SYRINGE | INTRAMUSCULAR | Status: AC | PRN
  Administered 2014-06-27 (×2): 1 mg via INTRAVENOUS

## 2014-06-27 MED FILL — Medication: Qty: 1 | Status: AC

## 2014-06-28 NOTE — ED Notes (Signed)
Pt taken to morgue. Bed placement aware

## 2014-07-01 ENCOUNTER — Telehealth: Payer: Self-pay | Admitting: Cardiology

## 2014-07-01 NOTE — Telephone Encounter (Signed)
Original d/c received From [facility] Dr.Skins in office Tomorrow (7.7) Will Present  To him then 7.6.15/km

## 2014-07-02 ENCOUNTER — Telehealth: Payer: Self-pay | Admitting: Cardiology

## 2014-07-02 NOTE — Telephone Encounter (Signed)
Original d/c Signed by Dr.Skains Alla Feeling [phone] ready For pick up 7.7.15/km

## 2014-07-02 NOTE — Telephone Encounter (Signed)
D/C Picked Up  °

## 2014-07-27 DIAGNOSIS — 419620001 Death: Secondary | SNOMED CT

## 2014-07-27 NOTE — ED Provider Notes (Signed)
CSN: [id number]     Arrival date & time 07/10/2014  2041 History   First MD Initiated Contact with Patient 09-Mar-2014 2050     No chief complaint on file.  (Consider location/radiation/quality/duration/timing/severity/associated sxs/prior Treatment) HPI Patient arrived with EMS unresponsive. CPR in progress.   Patient reportedly found 15 minutes after last seen normal. Was heard gurgling. No CPR performed prior to EMS arrival 15 minutes later. PTA, patient received multiple rounds of epi, continuous compressions. No ROSC. No shockable rhythm.   Past Medical History  Diagnosis Date  . Hypertension   . CHF (congestive heart failure)   . Atrial flutter   . Anxiety   . GERD (gastroesophageal reflux disease)   . Atrial fibrillation with RVR 06/02/2014  . NICM (nonischemic cardiomyopathy), presumed EF 15% by echo 06/02/2014  . Ascites 06/02/2014  . Hepatopathy, congested 06/02/2014  . Hyposmolality and/or hyponatremia, due to CHF 06/02/2014  . Iliac aneurysm 06/02/2014   Past Surgical History  Procedure Laterality Date  . Cardioversion  07/2011    Hattie Perch/notes 08/16/2011  . Inguinal hernia repair Bilateral 06/2007    Hattie Perch/notes 07/10/2007   No family history on file. History  Substance Use Topics  . Smoking status: Never Smoker   . Smokeless tobacco: Never Used  . Alcohol Use: Yes     Comment: 05/31/2014 "no alcohol for years; never had problems w/alcohol"    Review of Systems  Unable to perform ROS: Patient unresponsive      Allergies  Review of patient's allergies indicates no known allergies.  Home Medications   Prior to Admission medications   Medication Sig Start Date End Date Taking? Authorizing Provider  aspirin 81 MG tablet Take 81 mg by mouth daily.    Historical Provider, MD  furosemide (LASIX) 40 MG tablet Take 40 mg by mouth daily.    Historical Provider, MD  HYDROcodone-acetaminophen (NORCO/VICODIN) 5-325 MG per tablet Take 1 tablet by mouth every 6 (six) hours as needed for moderate  pain. 06/12/14   Juliet RudeNathan R. Pickering, MD  isosorbide-hydrALAZINE (BIDIL) 20-37.5 MG per tablet Take 1 tablet by mouth daily.    Historical Provider, MD  phenazopyridine (PYRIDIUM) 200 MG tablet  06/02/14   Historical Provider, MD  potassium chloride SA (K-DUR,KLOR-CON) 20 MEQ tablet Take 20 mEq by mouth 2 (two) times daily.    Historical Provider, MD  SSD 1 % cream Apply 1 application topically daily. To legs 05/30/14   Historical Provider, MD   There were no vitals taken for this visit. Physical Exam  Constitutional: He appears well-developed and well-nourished. He appears toxic. He is intubated. Backboard in place.  HENT:  Head: Atraumatic.  Eyes: Right conjunctiva is not injected. Left conjunctiva is not injected. Right pupil is not reactive. Right pupil is round. Left pupil is not reactive. Left pupil is round. Pupils are equal.  Cardiovascular: Exam reveals decreased pulses.   Pulses:      Carotid pulses are 0 on the right side, and 0 on the left side.      Femoral pulses are 0 on the right side, and 0 on the left side. Pulmonary/Chest: He is intubated. He has no decreased breath sounds.  Abdominal: He exhibits distension.  Musculoskeletal:       Right lower leg: He exhibits swelling and edema.       Left lower leg: He exhibits swelling and edema.  Neurological: He is unresponsive.  Skin: Skin is dry. There is pallor.    ED Course  Procedures (including  critical care time) Labs Review Labs Reviewed - No data to display  Imaging Review No results found.   EKG Interpretation None      MDM   Final diagnoses:  None   CPR  EMS arrival at 2005, CPR prior to arrival for 20+ minutes, asystole, IVs, king airway.   CPR resumed. Epi x2. Compressions continued. Airway replaced with ETT, procedure as above. Compressions continued throughout. Epi given multiple times. On rhythm checks, patient with asystole, no pulse appreciated. Further CPR futile. TOD called as documented. Patient  seen and evaluated by myself and my attending, Dr. Preston Fleeting.    Imagene Sheller, MD 06/28/14 [phone]

## 2014-07-27 NOTE — Code Documentation (Signed)
Family updated as to patient's status by Dr. Preston Fleeting.

## 2014-07-27 NOTE — ED Notes (Signed)
Chaplin with family at bedside.  

## 2014-07-27 NOTE — Progress Notes (Signed)
Chaplain was already in the ED and responded to page for CPR in progress in Trauma C. Chaplain accompanied family to consult B. Pt's mother and three of pt's cousins were present. Dr. Preston Fleeting informed family of pt's passing and that he probably had already died at home before EMS arrived. Moved family to consult A while pt was being moved to room 27. Provided emotional and spiritual support for pt's mom and prayed with her. Accompanied family to room 27 to be with pt and say their goodbyes. Got contact info from pt's mom including name of funeral home.  Accompanied family out of the department.

## 2014-07-27 NOTE — ED Notes (Signed)
Patient arrived via [facility] from home with a witnessed cardiac arrest. Fire was the first to arrive and initiated CPR at apprx 20105. EMS arrived on scene at 2010 and continued CPR and placed a [facility] airway. EMS administered Narcan x2, EPI x9, 1 amp D50 x 1. Lucus was in place upon arrival to the ED, no pulses present, rhythm asystole. Family in route to the ER.

## 2014-07-27 NOTE — ED Provider Notes (Addendum)
58 year old male brought in as a cardiac arrest with CPR in progress. Family member heard him gurgling and noted that he was unresponsive and called for fire rescue. Rescue arrived about 15 minutes later with no CPR being done and initiated CPR. EMS arrived at about 09-May-2004 and initiated CPR. He was noted to be in asystole. IV was started including airway was inserted. He was given multiple doses of epinephrine but never had any improvement in his rhythm. On arrival in the ED, he was noted to be in asystole with only a few occasional agonal beats which were not perfusing. King airway was removed and endotracheal tube was inserted by Dr. Gwendolyn Grant. This was done under my direct supervision and was present for the entire procedure. He received additional epinephrine with no changes. Rhythm check showed ongoing asystole. He was felt to demonstrated cardiac unresponsiveness and was pronounced dead at 09-May-2048. Family is here and I have explained the outcome to them. I have reviewed his old records and he had been hospitalized here about 3 weeks ago for CHF exacerbation and had signed out AGAINST MEDICAL ADVICE. Ejection fraction was noted to be 10-15%. case is discussed with Dr. Adolm Joseph who is on call for Dr. Anne Fu, and he thinks that Dr. Anne Fu   will sign the death suspected.   Cardiopulmonary Resuscitation (CPR) Procedure Note Directed/Performed by: Dione Booze I personally directed ancillary staff and/or performed CPR in an effort to regain return of spontaneous circulation and to maintain cardiac, neuro and systemic perfusion.   INTUBATION Performed by: Westley Gambles M.D., directly supervised by Genoveva Ill.D. who is present through the entire procedure  Required items: required blood products, implants, devices, and special equipment available Patient identity confirmed: provided demographic data and hospital-assigned identification number Time out: Immediately prior to procedure a "time out" was called to  verify the correct patient, procedure, equipment, support staff and site/side marked as required.  Indications: Cardiac arrest with a cane the airway in place   Intubation method: Direct Laryngoscopy   Preoxygenation: BVM  Sedatives: None  Paralytic: None   Tube Size: 7.5 cuffed  Post-procedure assessment: chest rise and ETCO2 monitor Breath sounds: equal and absent over the epigastrium Tube secured with: ETT holder Chest x-ray was not able to be obtained as patient died before x-ray could be obtained  Patient tolerated the procedure well with no immediate complications.     I saw and evaluated the patient, reviewed the resident's note and I agree with the findings and plan.    Dione Booze, MD 07/19/14 [id number]  Dione Booze, MD 07/16/14 [id number]

## 2014-07-27 NOTE — Code Documentation (Signed)
King airway removed. 

## 2014-07-27 NOTE — ED Notes (Signed)
Per [facility] Family has left.

## 2014-07-27 DEATH — deceased

## 2014-12-18 ENCOUNTER — Other Ambulatory Visit (HOSPITAL_COMMUNITY): Payer: BC Managed Care – PPO

## 2014-12-18 ENCOUNTER — Ambulatory Visit: Payer: BC Managed Care – PPO | Admitting: Vascular Surgery
